# Patient Record
Sex: Female | Born: 1945 | Race: Black or African American | Hispanic: No | Smoking: Former smoker
Health system: Southern US, Community
[De-identification: ages and names within clinical notes are randomized; demographics above are authoritative.]

## PROBLEM LIST (undated history)

## (undated) DIAGNOSIS — R7303 Prediabetes: Secondary | ICD-10-CM

## (undated) DIAGNOSIS — I10 Essential (primary) hypertension: Secondary | ICD-10-CM

## (undated) DIAGNOSIS — E0590 Thyrotoxicosis, unspecified without thyrotoxic crisis or storm: Secondary | ICD-10-CM

## (undated) DIAGNOSIS — E7800 Pure hypercholesterolemia, unspecified: Secondary | ICD-10-CM

## (undated) DIAGNOSIS — M109 Gout, unspecified: Secondary | ICD-10-CM

## (undated) HISTORY — PX: BREAST EXCISIONAL BIOPSY: SUR124

## (undated) HISTORY — DX: Prediabetes: R73.03

## (undated) HISTORY — DX: Gout, unspecified: M10.9

## (undated) HISTORY — PX: COLONOSCOPY: SHX5424

---

## 1978-04-22 HISTORY — PX: ABDOMINAL HYSTERECTOMY: SHX81

## 2006-01-02 ENCOUNTER — Ambulatory Visit: Payer: Self-pay | Admitting: Internal Medicine

## 2006-02-03 ENCOUNTER — Ambulatory Visit: Payer: Self-pay | Admitting: Internal Medicine

## 2006-08-08 ENCOUNTER — Ambulatory Visit: Payer: Self-pay | Admitting: Family Medicine

## 2007-04-27 ENCOUNTER — Ambulatory Visit: Payer: Self-pay | Admitting: Gastroenterology

## 2007-05-26 ENCOUNTER — Ambulatory Visit: Payer: Self-pay | Admitting: Internal Medicine

## 2010-11-12 ENCOUNTER — Ambulatory Visit: Payer: Self-pay | Admitting: Internal Medicine

## 2010-11-20 ENCOUNTER — Ambulatory Visit: Payer: Self-pay | Admitting: Internal Medicine

## 2011-01-15 ENCOUNTER — Emergency Department: Payer: Self-pay | Admitting: Emergency Medicine

## 2011-11-25 ENCOUNTER — Ambulatory Visit: Payer: Self-pay

## 2012-01-16 ENCOUNTER — Ambulatory Visit: Payer: Self-pay

## 2013-02-11 ENCOUNTER — Ambulatory Visit: Payer: Self-pay | Admitting: Internal Medicine

## 2014-04-19 ENCOUNTER — Ambulatory Visit: Payer: Self-pay | Admitting: Internal Medicine

## 2015-02-06 ENCOUNTER — Other Ambulatory Visit: Payer: Self-pay | Admitting: Internal Medicine

## 2015-02-06 DIAGNOSIS — Z1231 Encounter for screening mammogram for malignant neoplasm of breast: Secondary | ICD-10-CM

## 2015-04-21 ENCOUNTER — Ambulatory Visit
Admission: RE | Admit: 2015-04-21 | Discharge: 2015-04-21 | Disposition: A | Discharge status: Home / Self Care | Payer: Medicare Other | Source: Ambulatory Visit | Attending: Internal Medicine | Admitting: Internal Medicine

## 2015-04-21 ENCOUNTER — Other Ambulatory Visit: Payer: Self-pay | Admitting: Internal Medicine

## 2015-04-21 DIAGNOSIS — Z1231 Encounter for screening mammogram for malignant neoplasm of breast: Secondary | ICD-10-CM

## 2015-06-26 ENCOUNTER — Encounter: Payer: Medicare Other | Attending: Internal Medicine | Admitting: Dietician

## 2015-06-26 ENCOUNTER — Encounter: Payer: Self-pay | Admitting: Dietician

## 2015-06-26 VITALS — Ht 63.0 in | Wt 156.8 lb

## 2015-06-26 DIAGNOSIS — E663 Overweight: Secondary | ICD-10-CM

## 2015-06-26 DIAGNOSIS — R739 Hyperglycemia, unspecified: Secondary | ICD-10-CM

## 2015-06-26 NOTE — Progress Notes (Signed)
Medical Nutrition Therapy: Visit start time: 0930  end time: 1030  Assessment:  Diagnosis: hyperglycemia Past medical history: HTN, hyperlipidemia Psychosocial issues/ stress concerns: none Preferred learning method:  . Hands-on  Current weight: 156.8lbs  Height: 5'3" Medications, supplements: reviewed list in chart with patient  Progress and evaluation: Patient reports some weight loss, down to 151lbs in the past year, but more recently has regained a few lbs.          She is ready to implement further change to resume weight loss.          Her sister has diabetes, and patient is ready to make changes to prevent diabetes herself.    Physical activity: gym, walking 60-90 minutes (stays at gym for 2 hours)  Dietary Intake:  Usual eating pattern includes 2-3 meals and 2-3 snacks per day. Dining out frequency: 7 meals per week.  Breakfast: 9-9:30 coffee with no sugar, bacon and eggs, limiting bread. Sometimes cereal with almond milk unsweetened. Sometimes Herbalife smoothie with almond milk and banana Snack: sometimes candy bar or nabs and soda, or meat skins Lunch: often wendy's or [employer] salads 3-4 times a week; sandwich grilled cheese, or pinto beans, sometimes skips if not hungry..  Snack: nabs crackers, or pork rinds (meat skins) if no lunch Supper: same foods as lunch Snack: was eating ice cream, has been avoiding recently Beverages: water, black coffee (was drinking several tsps sugar until last week), stopped regular sodas, was drinking 1 Pepsi daily, some fruit juice.   Nutrition Care Education: Topics covered: Diabetes prevention, weight management Basic nutrition: basic food groups, appropriate nutrient balance, appropriate meal and snack schedule, general nutrition guidelines    Weight control: behavioral changes for weight loss; 1400kcal meal plan Diabetes: appropriate meal and snack schedule, appropriate carb intake and balance  Nutritional Diagnosis:  [place]-3.3  Overweight/obesity As related to history of excess calorie intake, inactivity.  As evidenced by patient report.  Intervention: Instruction as noted above.   Set goals with patient input.   Patient declined follow-up appointment at this time.   Education Materials given:  . General diet guidelines for Diabetes . Food lists/ Planning A Balanced Meal . Sample meal pattern/ menus: Quick and Healthy Meal Ideas . Goals/ instructions  Learner/ who was taught:  . Patient   Level of understanding: Marland Kitchen. Verbalizes/ demonstrates competency  Demonstrated degree of understanding via:   Teach back Learning barriers: . None  Willingness to learn/ readiness for change: . Eager, change in progress  Monitoring and Evaluation:  Dietary intake, exercise, BG control, and body weight      follow up: prn

## 2015-06-26 NOTE — Patient Instructions (Signed)
   Control food portions by eating 1 cup (fist-size) or less of starches, palm-size or less of meats.   Include generous portions of low-carb veggies.   Keep limiting sugar in foods and drinks, choose fruit with a few nuts or lowfat cheese, a cup of yogurt, or a low-sugar granola bar with peanut butter for healthy snacks.   Continue your exercise and add another day or 2 each week when you can.

## 2016-01-18 ENCOUNTER — Other Ambulatory Visit: Payer: Self-pay | Admitting: Internal Medicine

## 2016-01-18 DIAGNOSIS — Z1231 Encounter for screening mammogram for malignant neoplasm of breast: Secondary | ICD-10-CM

## 2016-04-23 ENCOUNTER — Ambulatory Visit
Admission: RE | Admit: 2016-04-23 | Discharge: 2016-04-23 | Disposition: A | Discharge status: Home / Self Care | Payer: Medicare Other | Source: Ambulatory Visit | Attending: Internal Medicine | Admitting: Internal Medicine

## 2016-04-23 DIAGNOSIS — Z1231 Encounter for screening mammogram for malignant neoplasm of breast: Secondary | ICD-10-CM | POA: Diagnosis not present

## 2016-10-29 ENCOUNTER — Emergency Department
Admission: EM | Admit: 2016-10-29 | Discharge: 2016-10-29 | Disposition: A | Discharge status: Home / Self Care | Payer: Medicare Other | Attending: Emergency Medicine | Admitting: Emergency Medicine

## 2016-10-29 ENCOUNTER — Emergency Department: Payer: Medicare Other

## 2016-10-29 DIAGNOSIS — Z79899 Other long term (current) drug therapy: Secondary | ICD-10-CM | POA: Diagnosis not present

## 2016-10-29 DIAGNOSIS — W1849XA Other slipping, tripping and stumbling without falling, initial encounter: Secondary | ICD-10-CM | POA: Insufficient documentation

## 2016-10-29 DIAGNOSIS — Y998 Other external cause status: Secondary | ICD-10-CM | POA: Insufficient documentation

## 2016-10-29 DIAGNOSIS — Y9367 Activity, basketball: Secondary | ICD-10-CM | POA: Diagnosis not present

## 2016-10-29 DIAGNOSIS — Z87891 Personal history of nicotine dependence: Secondary | ICD-10-CM | POA: Diagnosis not present

## 2016-10-29 DIAGNOSIS — S99911A Unspecified injury of right ankle, initial encounter: Secondary | ICD-10-CM | POA: Diagnosis present

## 2016-10-29 DIAGNOSIS — T1490XA Injury, unspecified, initial encounter: Secondary | ICD-10-CM

## 2016-10-29 DIAGNOSIS — Y929 Unspecified place or not applicable: Secondary | ICD-10-CM | POA: Insufficient documentation

## 2016-10-29 DIAGNOSIS — S93401A Sprain of unspecified ligament of right ankle, initial encounter: Secondary | ICD-10-CM | POA: Insufficient documentation

## 2016-10-29 NOTE — ED Notes (Signed)
See triage note  States she was playing b/b with grandchild  Twisted right ankle   Was able to bear wt at first now having increased pain with min swelling noted   Positive pulses noted

## 2016-10-29 NOTE — ED Provider Notes (Signed)
[facility] Emergency Department Provider Note ____________________________________________  Time seen: 1830  I have reviewed the triage vital signs and the nursing notes.  HISTORY  Chief Complaint  Ankle Pain  HPI Laurie Spears is a 71 y.o. female presents to the ED for evaluation of right ankle pain. She was playing basketball with her grandson, when she sustained an inversion injury to the ankle. She reports lateral ankle pain and stiffness. She denies any other injury at this time.   No past medical history on file.  There are no active problems to display for this patient.   Past Surgical History:  Procedure Laterality Date  . ABDOMINAL HYSTERECTOMY  1980  . BREAST EXCISIONAL BIOPSY Bilateral yrs ago   benign    Prior to Admission medications   Medication Sig Start Date End Date Taking? Authorizing Provider  Loratadine 10 MG CAPS Take 10 mg by mouth.    [provider]  losartan-hydrochlorothiazide (HYZAAR) 100-25 MG tablet Take by mouth.    [provider]  methimazole (TAPAZOLE) 5 MG tablet Take 5 mg by mouth. 01/12/15   [provider]  simvastatin (ZOCOR) 20 MG tablet Take 20 mg by mouth.    [provider]    Allergies Patient has no known allergies.  Family History  Problem Relation Age of Onset  . Breast cancer Neg Hx     Social History Social History  Substance Use Topics  . Smoking status: Former Smoker    Quit date: 06/26/1994  . Smokeless tobacco: Not on file  . Alcohol use No    Review of Systems  Constitutional: Negative for fever. Cardiovascular: Negative for chest pain. Respiratory: Negative for shortness of breath. Musculoskeletal: Negative for back pain. Right ankle pain as above.  Skin: Negative for rash. Neurological: Negative for headaches, focal weakness or numbness. ____________________________________________  PHYSICAL EXAM:  VITAL SIGNS: ED Triage Vitals  Enc Vitals  Group     BP 10/29/16 1810 126/87     Pulse --      Resp 10/29/16 1810 16     Temp 10/29/16 1810 98.6 F (37 C)     Temp Source 10/29/16 1810 Oral     SpO2 10/29/16 1810 96 %     Weight 10/29/16 1811 158 lb (71.7 kg)     Height 10/29/16 1811 5\' 2"  (1.575 m)     Head Circumference --      Peak Flow --      Pain Score 10/29/16 1810 5     Pain Loc --      Pain Edu? --      Excl. in GC? --     Constitutional: Alert and oriented. Well appearing and in no distress. Head: Normocephalic and atraumatic. Cardiovascular: Normal rate, regular rhythm. Normal distal pulses. Respiratory: Normal respiratory effort. No wheezes/rales/rhonchi. Musculoskeletal: Right foot and ankle without any obvious deformity, dislocation, or effusion. Patient with normal ankle range of motion in all planes. Negative anterior anterior/posterior drawer. No calf or Achilles tenderness is noted. Nontender with normal range of motion in all extremities.  Neurologic:  Antalgic gait without ataxia. Normal speech and language. No gross focal neurologic deficits are appreciated. Skin:  Skin is warm, dry and intact. No rash noted. ____________________________________________   RADIOLOGY  Right Ankle  IMPRESSION: 1. No acute fracture or dislocation identified. 2. Productive changes of the talar neck may represent anterior ankle Impingement.  I, Lashan Gluth, Charlesetta IvoryJenise V Bacon, personally viewed and evaluated these images (plain radiographs)  as part of my medical decision making, as well as reviewing the written report by the radiologist. ____________________________________________  PROCEDURES  Velcro Stirrup Splint Crutches ____________________________________________  INITIAL IMPRESSION / ASSESSMENT AND PLAN / ED COURSE  Patient with ED evaluation of a ankle sprain on the right. No radiologic evidence of fracture or dislocation. Patient does have some mild impingement noted on x-ray due to bone spur dorsally. She is  fitted with a Velcro stirrup splint and given crutches to ambulate. She will follow with podiatry or return to the ED as needed. She is inclined to dose Tylenol and Motrin for pain relief. ____________________________________________  FINAL CLINICAL IMPRESSION(S) / ED DIAGNOSES  Final diagnoses:  Injury  Sprain of right ankle, unspecified ligament, initial encounter      Lissa Hoard, PA-C 10/29/16 2101    Sharman Cheek, MD 10/29/16 2313

## 2016-10-29 NOTE — ED Triage Notes (Signed)
Pt was playing basketball with her grandson and twisted her ankle. Was able to walk on it but painful

## 2016-10-29 NOTE — Discharge Instructions (Signed)
Your x-ray did not reveal a fracture. Wear the ankle brace when walking. Use the crutches to assist. Rest with the foot elevated when seated. Follow-up with podiatry for continued symptoms. Take ibuprofen and/or Tylenol for pain relief.

## 2017-05-05 ENCOUNTER — Other Ambulatory Visit: Payer: Self-pay | Admitting: Internal Medicine

## 2017-05-05 DIAGNOSIS — Z1231 Encounter for screening mammogram for malignant neoplasm of breast: Secondary | ICD-10-CM

## 2017-05-26 ENCOUNTER — Ambulatory Visit
Admission: RE | Admit: 2017-05-26 | Discharge: 2017-05-26 | Disposition: A | Discharge status: Home / Self Care | Payer: Medicare Other | Source: Ambulatory Visit | Attending: Internal Medicine | Admitting: Internal Medicine

## 2017-05-26 ENCOUNTER — Other Ambulatory Visit: Payer: Self-pay | Admitting: Internal Medicine

## 2017-05-26 DIAGNOSIS — Z1231 Encounter for screening mammogram for malignant neoplasm of breast: Secondary | ICD-10-CM | POA: Diagnosis not present

## 2018-06-16 ENCOUNTER — Other Ambulatory Visit: Payer: Self-pay | Admitting: Family

## 2018-06-16 DIAGNOSIS — Z1231 Encounter for screening mammogram for malignant neoplasm of breast: Secondary | ICD-10-CM

## 2018-07-07 ENCOUNTER — Other Ambulatory Visit: Payer: Self-pay

## 2018-07-07 ENCOUNTER — Ambulatory Visit
Admission: RE | Admit: 2018-07-07 | Discharge: 2018-07-07 | Disposition: A | Discharge status: Home / Self Care | Payer: Medicare Other | Source: Ambulatory Visit | Attending: Family | Admitting: Family

## 2018-07-07 DIAGNOSIS — Z1231 Encounter for screening mammogram for malignant neoplasm of breast: Secondary | ICD-10-CM | POA: Insufficient documentation

## 2019-02-05 ENCOUNTER — Other Ambulatory Visit: Payer: Self-pay | Admitting: Internal Medicine

## 2019-02-05 DIAGNOSIS — M79601 Pain in right arm: Secondary | ICD-10-CM

## 2019-02-12 ENCOUNTER — Ambulatory Visit
Admission: RE | Admit: 2019-02-12 | Discharge: 2019-02-12 | Disposition: A | Discharge status: Home / Self Care | Payer: Medicare Other | Source: Ambulatory Visit | Attending: Internal Medicine | Admitting: Internal Medicine

## 2019-02-12 ENCOUNTER — Other Ambulatory Visit: Payer: Self-pay

## 2019-02-12 ENCOUNTER — Encounter (INDEPENDENT_AMBULATORY_CARE_PROVIDER_SITE_OTHER): Payer: Self-pay

## 2019-02-12 DIAGNOSIS — M79601 Pain in right arm: Secondary | ICD-10-CM | POA: Insufficient documentation

## 2019-07-27 ENCOUNTER — Other Ambulatory Visit: Payer: Self-pay | Admitting: Internal Medicine

## 2019-07-27 DIAGNOSIS — Z1231 Encounter for screening mammogram for malignant neoplasm of breast: Secondary | ICD-10-CM

## 2019-08-10 ENCOUNTER — Ambulatory Visit
Admission: RE | Admit: 2019-08-10 | Discharge: 2019-08-10 | Disposition: A | Discharge status: Home / Self Care | Payer: Medicare Other | Source: Ambulatory Visit | Attending: Internal Medicine | Admitting: Internal Medicine

## 2019-08-10 DIAGNOSIS — Z1231 Encounter for screening mammogram for malignant neoplasm of breast: Secondary | ICD-10-CM

## 2020-06-12 ENCOUNTER — Other Ambulatory Visit: Payer: Self-pay | Admitting: Internal Medicine

## 2020-06-12 DIAGNOSIS — Z1231 Encounter for screening mammogram for malignant neoplasm of breast: Secondary | ICD-10-CM

## 2020-07-24 DIAGNOSIS — I10 Essential (primary) hypertension: Secondary | ICD-10-CM | POA: Diagnosis not present

## 2020-07-24 DIAGNOSIS — R7302 Impaired glucose tolerance (oral): Secondary | ICD-10-CM | POA: Diagnosis not present

## 2020-07-24 DIAGNOSIS — M109 Gout, unspecified: Secondary | ICD-10-CM | POA: Diagnosis not present

## 2020-07-24 DIAGNOSIS — E782 Mixed hyperlipidemia: Secondary | ICD-10-CM | POA: Diagnosis not present

## 2020-08-15 ENCOUNTER — Ambulatory Visit
Admission: RE | Admit: 2020-08-15 | Discharge: 2020-08-15 | Disposition: A | Discharge status: Home / Self Care | Payer: Medicare Other | Source: Ambulatory Visit | Attending: Internal Medicine | Admitting: Internal Medicine

## 2020-08-15 ENCOUNTER — Other Ambulatory Visit: Payer: Self-pay

## 2020-08-15 DIAGNOSIS — Z1231 Encounter for screening mammogram for malignant neoplasm of breast: Secondary | ICD-10-CM | POA: Diagnosis not present

## 2020-11-27 DIAGNOSIS — E782 Mixed hyperlipidemia: Secondary | ICD-10-CM | POA: Diagnosis not present

## 2020-11-27 DIAGNOSIS — R7302 Impaired glucose tolerance (oral): Secondary | ICD-10-CM | POA: Diagnosis not present

## 2020-11-27 DIAGNOSIS — I10 Essential (primary) hypertension: Secondary | ICD-10-CM | POA: Diagnosis not present

## 2020-11-27 DIAGNOSIS — M109 Gout, unspecified: Secondary | ICD-10-CM | POA: Diagnosis not present

## 2020-12-04 DIAGNOSIS — E0520 Thyrotoxicosis with toxic multinodular goiter without thyrotoxic crisis or storm: Secondary | ICD-10-CM | POA: Diagnosis not present

## 2020-12-11 DIAGNOSIS — E0520 Thyrotoxicosis with toxic multinodular goiter without thyrotoxic crisis or storm: Secondary | ICD-10-CM | POA: Diagnosis not present

## 2021-01-15 DIAGNOSIS — H25813 Combined forms of age-related cataract, bilateral: Secondary | ICD-10-CM | POA: Diagnosis not present

## 2021-01-15 DIAGNOSIS — H5203 Hypermetropia, bilateral: Secondary | ICD-10-CM | POA: Diagnosis not present

## 2021-01-15 DIAGNOSIS — H524 Presbyopia: Secondary | ICD-10-CM | POA: Diagnosis not present

## 2021-01-15 DIAGNOSIS — H52223 Regular astigmatism, bilateral: Secondary | ICD-10-CM | POA: Diagnosis not present

## 2021-01-20 ENCOUNTER — Emergency Department
Admission: EM | Admit: 2021-01-20 | Discharge: 2021-01-20 | Disposition: A | Discharge status: Home / Self Care | Payer: Medicare Other | Attending: Emergency Medicine | Admitting: Emergency Medicine

## 2021-01-20 ENCOUNTER — Emergency Department: Payer: Medicare Other

## 2021-01-20 ENCOUNTER — Encounter: Payer: Self-pay | Admitting: Intensive Care

## 2021-01-20 ENCOUNTER — Other Ambulatory Visit: Payer: Self-pay

## 2021-01-20 DIAGNOSIS — R112 Nausea with vomiting, unspecified: Secondary | ICD-10-CM | POA: Insufficient documentation

## 2021-01-20 DIAGNOSIS — I10 Essential (primary) hypertension: Secondary | ICD-10-CM | POA: Diagnosis not present

## 2021-01-20 DIAGNOSIS — Z20822 Contact with and (suspected) exposure to covid-19: Secondary | ICD-10-CM | POA: Diagnosis not present

## 2021-01-20 DIAGNOSIS — Z79899 Other long term (current) drug therapy: Secondary | ICD-10-CM | POA: Diagnosis not present

## 2021-01-20 DIAGNOSIS — Z87891 Personal history of nicotine dependence: Secondary | ICD-10-CM | POA: Insufficient documentation

## 2021-01-20 DIAGNOSIS — R42 Dizziness and giddiness: Secondary | ICD-10-CM | POA: Insufficient documentation

## 2021-01-20 DIAGNOSIS — J029 Acute pharyngitis, unspecified: Secondary | ICD-10-CM | POA: Diagnosis not present

## 2021-01-20 DIAGNOSIS — R059 Cough, unspecified: Secondary | ICD-10-CM

## 2021-01-20 DIAGNOSIS — R109 Unspecified abdominal pain: Secondary | ICD-10-CM | POA: Diagnosis not present

## 2021-01-20 HISTORY — DX: Essential (primary) hypertension: I10

## 2021-01-20 HISTORY — DX: Pure hypercholesterolemia, unspecified: E78.00

## 2021-01-20 HISTORY — DX: Thyrotoxicosis, unspecified without thyrotoxic crisis or storm: E05.90

## 2021-01-20 LAB — BASIC METABOLIC PANEL
Anion gap: 9 (ref 5–15)
BUN: 13 mg/dL (ref 8–23)
CO2: 23 mmol/L (ref 22–32)
Calcium: 8.4 mg/dL — ABNORMAL LOW (ref 8.9–10.3)
Chloride: 102 mmol/L (ref 98–111)
Creatinine, Ser: 0.83 mg/dL (ref 0.44–1.00)
GFR, Estimated: >60 mL/min (ref >60)
Glucose, Bld: 117 mg/dL — ABNORMAL HIGH (ref 70–99)
Potassium: 3.7 mmol/L (ref 3.5–5.1)
Sodium: 134 mmol/L — ABNORMAL LOW (ref 135–145)

## 2021-01-20 LAB — CBC
HCT: 36.5 % (ref 36.0–46.0)
Hemoglobin: 12.5 g/dL (ref 12.0–15.0)
MCH: 29.3 pg (ref 26.0–34.0)
MCHC: 34.2 g/dL (ref 30.0–36.0)
MCV: 85.5 fL (ref 80.0–100.0)
Platelets: 203 10*3/uL (ref 150–400)
RBC: 4.27 MIL/uL (ref 3.87–5.11)
RDW: 13.3 % (ref 11.5–15.5)
WBC: 6.9 10*3/uL (ref 4.0–10.5)
nRBC: 0 % (ref 0.0–0.2)

## 2021-01-20 LAB — TROPONIN I (HIGH SENSITIVITY): Troponin I (High Sensitivity): <2 ng/L (ref <18)

## 2021-01-20 LAB — RESP PANEL BY RT-PCR (FLU A&B, COVID) ARPGX2
Influenza A by PCR: NEGATIVE
Influenza B by PCR: NEGATIVE
SARS Coronavirus 2 by RT PCR: NEGATIVE

## 2021-01-20 MED ORDER — ONDANSETRON HCL 4 MG PO TABS: 4.0000 mg | ORAL_TABLET | Freq: Three times a day (TID) | ORAL | 0 refills | Status: DC | PRN

## 2021-01-20 NOTE — ED Triage Notes (Signed)
Pt c/o dizziness while at work accompanied with sore throat, abdominal discomfort and emesis.

## 2021-01-20 NOTE — ED Provider Notes (Signed)
[facility] Emergency Department Provider Note  ____________________________________________   I have reviewed the triage vital signs and the nursing notes.   HISTORY  Chief Complaint Dizziness, Emesis, and Abdominal Pain   History limited by: Not Limited   HPI Laurie Spears is a 75 y.o. female who presents to the emergency department today because of concern for nausea, vomiting and lightheadedness. Symptoms occurred when the patient was at work. She states that she started to feel nauseous so went to the bathroom. Did throw up multiple times. She then felt lightheaded and dizzy. Felt like she would not be able to get up off the floor. The patient states that she had similar episode once in the past where she throw up and felt lightheaded. Did not seek care at that time. She additionally states that over the past few days she has had cold like symptoms. Cough, sore throat. Denies any fevers or chills.   Records reviewed. Per medical record review patient has a history of HLD, HTN.   Past Medical History:  Diagnosis Date   High cholesterol    Hypertension    Hyperthyroidism     There are no problems to display for this patient.   Past Surgical History:  Procedure Laterality Date   ABDOMINAL HYSTERECTOMY  1980   BREAST EXCISIONAL BIOPSY Bilateral yrs ago   benign    Prior to Admission medications   Medication Sig Start Date End Date Taking? Authorizing Provider  Loratadine 10 MG CAPS Take 10 mg by mouth.    [provider]  losartan-hydrochlorothiazide (HYZAAR) 100-25 MG tablet Take by mouth.    [provider]  methimazole (TAPAZOLE) 5 MG tablet Take 5 mg by mouth. 01/12/15   [provider]  simvastatin (ZOCOR) 20 MG tablet Take 20 mg by mouth.    [provider]    Allergies Patient has no known allergies.  Family History  Problem Relation Age of Onset   Breast cancer Neg Hx     Social History Social  History   Tobacco Use   Smoking status: Former    Types: Cigarettes    Quit date: 06/26/1994    Years since quitting: 26.5  Substance Use Topics   Alcohol use: No    Alcohol/week: 0.0 standard drinks   Drug use: Never    Review of Systems Constitutional: No fever/chills Eyes: No visual changes. ENT: No sore throat. Cardiovascular: Denies chest pain. Respiratory: Positive for cough. Gastrointestinal: No abdominal pain.  Positive for nausea, vomiting.  Genitourinary: Negative for dysuria. Musculoskeletal: Negative for back pain. Skin: Negative for rash. Neurological: Positive for lightheadedness.  ____________________________________________   PHYSICAL EXAM:  VITAL SIGNS: ED Triage Vitals [01/20/21 1254]  Enc Vitals Group     BP 128/71     Pulse Rate 75     Resp 16     Temp 98.8 F (37.1 C)     Temp Source Oral     SpO2 96 %     Weight 160 lb (72.6 kg)     Height 5\' 3"  (1.6 m)     Head Circumference      Peak Flow      Pain Score 0   Constitutional: Alert and oriented.  Eyes: Conjunctivae are normal.  ENT      Head: Normocephalic and atraumatic.      Nose: No congestion/rhinnorhea.      Mouth/Throat: Mucous membranes are moist.      Neck: No stridor. Hematological/Lymphatic/Immunilogical: No  cervical lymphadenopathy. Cardiovascular: Normal rate, regular rhythm.  No murmurs, rubs, or gallops.  Respiratory: Normal respiratory effort without tachypnea nor retractions. Breath sounds are clear and equal bilaterally. No wheezes/rales/rhonchi. Gastrointestinal: Soft and non tender. No rebound. No guarding.  Genitourinary: Deferred Musculoskeletal: Normal range of motion in all extremities. No lower extremity edema. Neurologic:  Normal speech and language. No gross focal neurologic deficits are appreciated.  Skin:  Skin is warm, dry and intact. No rash noted. Psychiatric: Mood and affect are normal. Speech and behavior are normal. Patient exhibits appropriate insight  and judgment.  ____________________________________________    LABS (pertinent positives/negatives)  CBC wbc 6.9, hgb 12.5, plt 203 BMP wnl except na 134, glu 117, ca 8.4 COVID negative Trop hs <2 ____________________________________________   EKG  I, Phineas Semen, attending physician, personally viewed and interpreted this EKG  EKG Time: 1258 Rate: 74 Rhythm: normal sinus rhythm Axis: normal Intervals: qtc 408 QRS: narrow, q waves III ST changes: no st elevation Impression: abnormal ekg  ____________________________________________    RADIOLOGY  CXR No acute cardiopulmonary process  ____________________________________________   PROCEDURES  Procedures  ____________________________________________   INITIAL IMPRESSION / ASSESSMENT AND PLAN / ED COURSE  Pertinent labs & imaging results that were available during my care of the patient were reviewed by me and considered in my medical decision making (see chart for details).   Patient presented to the emergency department today because of concerns for an episode of nausea vomiting and lightheadedness that occurred while she was at work.  Patient does feel better here in the emergency department.  Work-up without any concerning anemia or leukocytosis.  BMP without any concerning electrolyte abnormalities.  Troponin was negative.  At this time unclear etiology.  Do wonder if patient might of suffered from a viral illness given her recent cold-like symptoms.  Given that patient feels better I do think is reasonable for her to be discharged home.  Discussed findings and plan with patient.  ____________________________________________   FINAL CLINICAL IMPRESSION(S) / ED DIAGNOSES  Final diagnoses:  Nausea and vomiting, unspecified vomiting type  Lightheadedness     Note: This dictation was prepared with Dragon dictation. Any transcriptional errors that result from this process are unintentional      Phineas Semen, MD 01/20/21 1811

## 2021-01-20 NOTE — ED Notes (Signed)
Patient c/o overall weakness and dizziness that started this am. States she has had cold like symptoms for the last two days.

## 2021-01-20 NOTE — ED Notes (Signed)
Patient given discharge instructions, all questions answered. Patient in possession of all belongings, directed to the discharge area  

## 2021-01-20 NOTE — Discharge Instructions (Addendum)
Please seek medical attention for any high fevers, chest pain, shortness of breath, change in behavior, persistent vomiting, bloody stool or any other new or concerning symptoms.  

## 2021-02-05 DIAGNOSIS — Z23 Encounter for immunization: Secondary | ICD-10-CM | POA: Diagnosis not present

## 2021-04-02 DIAGNOSIS — R7302 Impaired glucose tolerance (oral): Secondary | ICD-10-CM | POA: Diagnosis not present

## 2021-04-02 DIAGNOSIS — I10 Essential (primary) hypertension: Secondary | ICD-10-CM | POA: Diagnosis not present

## 2021-04-02 DIAGNOSIS — E782 Mixed hyperlipidemia: Secondary | ICD-10-CM | POA: Diagnosis not present

## 2021-04-02 DIAGNOSIS — M109 Gout, unspecified: Secondary | ICD-10-CM | POA: Diagnosis not present

## 2021-04-02 DIAGNOSIS — E559 Vitamin D deficiency, unspecified: Secondary | ICD-10-CM | POA: Diagnosis not present

## 2021-05-20 DIAGNOSIS — I10 Essential (primary) hypertension: Secondary | ICD-10-CM | POA: Diagnosis not present

## 2021-05-20 DIAGNOSIS — M109 Gout, unspecified: Secondary | ICD-10-CM | POA: Diagnosis not present

## 2021-05-20 DIAGNOSIS — E782 Mixed hyperlipidemia: Secondary | ICD-10-CM | POA: Diagnosis not present

## 2021-07-09 DIAGNOSIS — H25813 Combined forms of age-related cataract, bilateral: Secondary | ICD-10-CM | POA: Diagnosis not present

## 2021-07-09 DIAGNOSIS — H3561 Retinal hemorrhage, right eye: Secondary | ICD-10-CM | POA: Diagnosis not present

## 2021-07-30 ENCOUNTER — Other Ambulatory Visit: Payer: Self-pay | Admitting: Internal Medicine

## 2021-07-30 DIAGNOSIS — M2550 Pain in unspecified joint: Secondary | ICD-10-CM | POA: Diagnosis not present

## 2021-07-30 DIAGNOSIS — I10 Essential (primary) hypertension: Secondary | ICD-10-CM | POA: Diagnosis not present

## 2021-07-30 DIAGNOSIS — R7302 Impaired glucose tolerance (oral): Secondary | ICD-10-CM | POA: Diagnosis not present

## 2021-07-30 DIAGNOSIS — M8589 Other specified disorders of bone density and structure, multiple sites: Secondary | ICD-10-CM | POA: Diagnosis not present

## 2021-07-30 DIAGNOSIS — E782 Mixed hyperlipidemia: Secondary | ICD-10-CM | POA: Diagnosis not present

## 2021-07-30 DIAGNOSIS — Z1231 Encounter for screening mammogram for malignant neoplasm of breast: Secondary | ICD-10-CM

## 2021-08-19 DIAGNOSIS — M109 Gout, unspecified: Secondary | ICD-10-CM | POA: Diagnosis not present

## 2021-08-19 DIAGNOSIS — I10 Essential (primary) hypertension: Secondary | ICD-10-CM | POA: Diagnosis not present

## 2021-08-19 DIAGNOSIS — E782 Mixed hyperlipidemia: Secondary | ICD-10-CM | POA: Diagnosis not present

## 2021-09-04 DIAGNOSIS — H25013 Cortical age-related cataract, bilateral: Secondary | ICD-10-CM | POA: Diagnosis not present

## 2021-09-04 DIAGNOSIS — H2513 Age-related nuclear cataract, bilateral: Secondary | ICD-10-CM | POA: Diagnosis not present

## 2021-09-04 DIAGNOSIS — H2511 Age-related nuclear cataract, right eye: Secondary | ICD-10-CM | POA: Diagnosis not present

## 2021-09-04 DIAGNOSIS — H18413 Arcus senilis, bilateral: Secondary | ICD-10-CM | POA: Diagnosis not present

## 2021-09-04 DIAGNOSIS — H40013 Open angle with borderline findings, low risk, bilateral: Secondary | ICD-10-CM | POA: Diagnosis not present

## 2021-09-10 ENCOUNTER — Ambulatory Visit
Admission: RE | Admit: 2021-09-10 | Discharge: 2021-09-10 | Disposition: A | Discharge status: Home / Self Care | Payer: Medicare Other | Source: Ambulatory Visit | Attending: Internal Medicine | Admitting: Internal Medicine

## 2021-09-10 ENCOUNTER — Inpatient Hospital Stay: Admission: RE | Admit: 2021-09-10 | Payer: Medicare Other | Source: Ambulatory Visit

## 2021-09-10 DIAGNOSIS — Z1231 Encounter for screening mammogram for malignant neoplasm of breast: Secondary | ICD-10-CM | POA: Diagnosis not present

## 2021-09-12 ENCOUNTER — Other Ambulatory Visit: Payer: Self-pay | Admitting: Internal Medicine

## 2021-09-13 ENCOUNTER — Other Ambulatory Visit: Payer: Self-pay | Admitting: Internal Medicine

## 2021-09-13 DIAGNOSIS — N6489 Other specified disorders of breast: Secondary | ICD-10-CM

## 2021-09-13 DIAGNOSIS — R928 Other abnormal and inconclusive findings on diagnostic imaging of breast: Secondary | ICD-10-CM

## 2021-09-25 ENCOUNTER — Ambulatory Visit
Admission: RE | Admit: 2021-09-25 | Discharge: 2021-09-25 | Disposition: A | Discharge status: Home / Self Care | Payer: Medicare Other | Source: Ambulatory Visit | Attending: Internal Medicine | Admitting: Internal Medicine

## 2021-09-25 ENCOUNTER — Other Ambulatory Visit: Payer: Medicare Other

## 2021-09-25 DIAGNOSIS — N6489 Other specified disorders of breast: Secondary | ICD-10-CM | POA: Diagnosis not present

## 2021-09-25 DIAGNOSIS — R928 Other abnormal and inconclusive findings on diagnostic imaging of breast: Secondary | ICD-10-CM | POA: Diagnosis not present

## 2021-09-25 DIAGNOSIS — R922 Inconclusive mammogram: Secondary | ICD-10-CM | POA: Diagnosis not present

## 2021-10-02 DIAGNOSIS — E782 Mixed hyperlipidemia: Secondary | ICD-10-CM | POA: Diagnosis not present

## 2021-10-02 DIAGNOSIS — I10 Essential (primary) hypertension: Secondary | ICD-10-CM | POA: Diagnosis not present

## 2021-10-02 DIAGNOSIS — Z79899 Other long term (current) drug therapy: Secondary | ICD-10-CM | POA: Diagnosis not present

## 2021-10-02 DIAGNOSIS — S8011XA Contusion of right lower leg, initial encounter: Secondary | ICD-10-CM | POA: Diagnosis not present

## 2021-10-02 DIAGNOSIS — R29890 Loss of height: Secondary | ICD-10-CM | POA: Diagnosis not present

## 2021-10-08 DIAGNOSIS — E0520 Thyrotoxicosis with toxic multinodular goiter without thyrotoxic crisis or storm: Secondary | ICD-10-CM | POA: Diagnosis not present

## 2021-10-15 DIAGNOSIS — E0520 Thyrotoxicosis with toxic multinodular goiter without thyrotoxic crisis or storm: Secondary | ICD-10-CM | POA: Diagnosis not present

## 2021-11-19 DIAGNOSIS — E782 Mixed hyperlipidemia: Secondary | ICD-10-CM | POA: Diagnosis not present

## 2021-11-19 DIAGNOSIS — M109 Gout, unspecified: Secondary | ICD-10-CM | POA: Diagnosis not present

## 2021-11-19 DIAGNOSIS — I10 Essential (primary) hypertension: Secondary | ICD-10-CM | POA: Diagnosis not present

## 2021-12-10 DIAGNOSIS — H2511 Age-related nuclear cataract, right eye: Secondary | ICD-10-CM | POA: Diagnosis not present

## 2021-12-11 DIAGNOSIS — H2512 Age-related nuclear cataract, left eye: Secondary | ICD-10-CM | POA: Diagnosis not present

## 2021-12-18 DIAGNOSIS — R7302 Impaired glucose tolerance (oral): Secondary | ICD-10-CM | POA: Diagnosis not present

## 2021-12-18 DIAGNOSIS — Z1231 Encounter for screening mammogram for malignant neoplasm of breast: Secondary | ICD-10-CM | POA: Diagnosis not present

## 2021-12-18 DIAGNOSIS — Z0001 Encounter for general adult medical examination with abnormal findings: Secondary | ICD-10-CM | POA: Diagnosis not present

## 2021-12-18 DIAGNOSIS — I10 Essential (primary) hypertension: Secondary | ICD-10-CM | POA: Diagnosis not present

## 2021-12-18 DIAGNOSIS — E782 Mixed hyperlipidemia: Secondary | ICD-10-CM | POA: Diagnosis not present

## 2021-12-18 DIAGNOSIS — E0580 Other thyrotoxicosis without thyrotoxic crisis or storm: Secondary | ICD-10-CM | POA: Diagnosis not present

## 2021-12-18 DIAGNOSIS — Z1211 Encounter for screening for malignant neoplasm of colon: Secondary | ICD-10-CM | POA: Diagnosis not present

## 2021-12-18 DIAGNOSIS — M109 Gout, unspecified: Secondary | ICD-10-CM | POA: Diagnosis not present

## 2021-12-31 DIAGNOSIS — H2512 Age-related nuclear cataract, left eye: Secondary | ICD-10-CM | POA: Diagnosis not present

## 2022-02-18 DIAGNOSIS — Z23 Encounter for immunization: Secondary | ICD-10-CM | POA: Diagnosis not present

## 2022-04-20 DIAGNOSIS — I10 Essential (primary) hypertension: Secondary | ICD-10-CM | POA: Diagnosis not present

## 2022-04-20 DIAGNOSIS — E782 Mixed hyperlipidemia: Secondary | ICD-10-CM | POA: Diagnosis not present

## 2022-04-23 DIAGNOSIS — E0520 Thyrotoxicosis with toxic multinodular goiter without thyrotoxic crisis or storm: Secondary | ICD-10-CM | POA: Diagnosis not present

## 2022-04-29 DIAGNOSIS — E0520 Thyrotoxicosis with toxic multinodular goiter without thyrotoxic crisis or storm: Secondary | ICD-10-CM | POA: Diagnosis not present

## 2022-05-06 DIAGNOSIS — I10 Essential (primary) hypertension: Secondary | ICD-10-CM | POA: Diagnosis not present

## 2022-05-06 DIAGNOSIS — E0580 Other thyrotoxicosis without thyrotoxic crisis or storm: Secondary | ICD-10-CM | POA: Diagnosis not present

## 2022-05-06 DIAGNOSIS — R7302 Impaired glucose tolerance (oral): Secondary | ICD-10-CM | POA: Diagnosis not present

## 2022-05-06 DIAGNOSIS — M109 Gout, unspecified: Secondary | ICD-10-CM | POA: Diagnosis not present

## 2022-05-06 DIAGNOSIS — E782 Mixed hyperlipidemia: Secondary | ICD-10-CM | POA: Diagnosis not present

## 2022-08-07 ENCOUNTER — Other Ambulatory Visit: Payer: Self-pay | Admitting: Internal Medicine

## 2022-08-09 ENCOUNTER — Other Ambulatory Visit: Payer: Self-pay | Admitting: Internal Medicine

## 2022-08-09 ENCOUNTER — Other Ambulatory Visit: Payer: Self-pay | Admitting: Family

## 2022-08-09 DIAGNOSIS — I10 Essential (primary) hypertension: Secondary | ICD-10-CM

## 2022-09-09 ENCOUNTER — Encounter: Payer: Self-pay | Admitting: Internal Medicine

## 2022-09-09 ENCOUNTER — Ambulatory Visit (INDEPENDENT_AMBULATORY_CARE_PROVIDER_SITE_OTHER): Payer: Medicare Other | Admitting: Internal Medicine

## 2022-09-09 ENCOUNTER — Other Ambulatory Visit: Payer: Self-pay | Admitting: Internal Medicine

## 2022-09-09 VITALS — BP 120/80 | HR 66 | Ht 63.0 in | Wt 168.4 lb

## 2022-09-09 DIAGNOSIS — M109 Gout, unspecified: Secondary | ICD-10-CM

## 2022-09-09 DIAGNOSIS — Z0000 Encounter for general adult medical examination without abnormal findings: Secondary | ICD-10-CM | POA: Insufficient documentation

## 2022-09-09 DIAGNOSIS — I10 Essential (primary) hypertension: Secondary | ICD-10-CM | POA: Insufficient documentation

## 2022-09-09 DIAGNOSIS — E0590 Thyrotoxicosis, unspecified without thyrotoxic crisis or storm: Secondary | ICD-10-CM | POA: Diagnosis not present

## 2022-09-09 DIAGNOSIS — R7302 Impaired glucose tolerance (oral): Secondary | ICD-10-CM

## 2022-09-09 DIAGNOSIS — E782 Mixed hyperlipidemia: Secondary | ICD-10-CM | POA: Insufficient documentation

## 2022-09-09 DIAGNOSIS — Z1231 Encounter for screening mammogram for malignant neoplasm of breast: Secondary | ICD-10-CM

## 2022-09-09 NOTE — Progress Notes (Signed)
Established Patient Office Visit  Subjective:  Patient ID: Laurie Spears, female    DOB: 07-May-1945  Age: 77 y.o. MRN: [id number]  Chief Complaint  Patient presents with   Follow-up    4 month follow up    Patient comes in for her follow-up today.  She has been feeling well and has no new complaints.  She is fasting for blood work.  She is due for mammogram and she already has it scheduled for next month.  She reports that 2 weeks ago she felt some left flank pain but it resolved spontaneously. Patient has gained some weight this time but promises to start exercising and controlling her diet.    No other concerns at this time.   Past Medical History:  Diagnosis Date   Gouty arthritis    High cholesterol    Hypertension    Hyperthyroidism    Prediabetes     Past Surgical History:  Procedure Laterality Date   ABDOMINAL HYSTERECTOMY  1980   BREAST EXCISIONAL BIOPSY Bilateral yrs ago   benign    Social History   Socioeconomic History   Marital status: Divorced    Spouse name: Not on file   Number of children: Not on file   Years of education: Not on file   Highest education level: Not on file  Occupational History   Not on file  Tobacco Use   Smoking status: Former    Types: Cigarettes    Quit date: 06/26/1994    Years since quitting: 28.2   Smokeless tobacco: Not on file  Substance and Sexual Activity   Alcohol use: No    Alcohol/week: 0.0 standard drinks of alcohol   Drug use: Never   Sexual activity: Not on file  Other Topics Concern   Not on file  Social History Narrative   Not on file   Social Determinants of Health   Financial Resource Strain: Not on file  Food Insecurity: Not on file  Transportation Needs: Not on file  Physical Activity: Not on file  Stress: Not on file  Social Connections: Not on file  Intimate Partner Violence: Not on file    Family History  Problem Relation Age of Onset   Breast cancer Neg Hx     No Known  Allergies  Review of Systems  Constitutional:  Negative for chills, diaphoresis, fever, malaise/fatigue and weight loss.  HENT:  Negative for ear pain, hearing loss, sore throat and tinnitus.   Eyes: Negative.   Respiratory:  Negative for cough, shortness of breath and wheezing.   Cardiovascular:  Negative for chest pain, palpitations, leg swelling and PND.  Gastrointestinal:  Negative for abdominal pain, constipation, diarrhea, heartburn, melena, nausea and vomiting.  Genitourinary:  Negative for dysuria, flank pain, frequency, hematuria and urgency.  Musculoskeletal:  Negative for falls, joint pain, myalgias and neck pain.  Skin: Negative.   Neurological: Negative.   Psychiatric/Behavioral:  Negative for depression. The patient is not nervous/anxious.        Objective:   BP 120/80   Pulse 66   Ht 5\' 3"  (1.6 m)   Wt 168 lb 6.4 oz (76.4 kg)   SpO2 96%   BMI 29.83 kg/m   Vitals:   09/09/22 0855  BP: 120/80  Pulse: 66  Height: 5\' 3"  (1.6 m)  Weight: 168 lb 6.4 oz (76.4 kg)  SpO2: 96%  BMI (Calculated): 29.84    Physical Exam Vitals and nursing note reviewed.  Constitutional:  Appearance: Normal appearance.  HENT:     Right Ear: Tympanic membrane and ear canal normal.     Left Ear: Tympanic membrane normal.     Nose: Nose normal.  Cardiovascular:     Rate and Rhythm: Normal rate.  Pulmonary:     Effort: Pulmonary effort is normal.     Breath sounds: Normal breath sounds. No wheezing or rales.  Abdominal:     General: Bowel sounds are normal. There is no distension.     Palpations: Abdomen is soft. There is no mass.     Tenderness: There is no right CVA tenderness, left CVA tenderness or guarding.     Hernia: No hernia is present.  Musculoskeletal:        General: Normal range of motion.     Cervical back: Normal range of motion and neck supple.  Skin:    General: Skin is warm and dry.  Neurological:     General: No focal deficit present.     Mental  Status: She is alert and oriented to person, place, and time.  Psychiatric:        Mood and Affect: Mood normal.        Behavior: Behavior normal.      No results found for any visits on 09/09/22.  No results found for this or any previous visit (from the past 2160 hour(s)).    Assessment & Plan:  Check fasting labs today.  Patient will start a strict diet plan.  Will monitor weight at follow-up. Problem List Items Addressed This Visit     Essential hypertension, benign - Primary   Relevant Medications   rosuvastatin (CRESTOR) 40 MG tablet   Other Relevant Orders   CMP14+EGFR   Impaired glucose tolerance   Relevant Orders   Hemoglobin A1c   Gouty arthritis   Relevant Medications   allopurinol (ZYLOPRIM) 100 MG tablet   Other Relevant Orders   CBC With Differential   Uric acid   Mixed hyperlipidemia   Relevant Medications   rosuvastatin (CRESTOR) 40 MG tablet   Other Relevant Orders   Lipid Panel w/o Chol/HDL Ratio   Breast cancer screening by mammogram   Relevant Orders   MM 3D SCREENING MAMMOGRAM BILATERAL BREAST   Hyperthyroidism    Return in about 3 months (around 12/10/2022).   Total time spent: 30 minutes  Margaretann Loveless, MD  09/09/2022   This document may have been prepared by [facility] Voice Recognition software and as such may include unintentional dictation errors.

## 2022-09-10 LAB — CMP14+EGFR
ALT: 39 IU/L — ABNORMAL HIGH (ref 0–32)
AST: 30 IU/L (ref 0–40)
Albumin/Globulin Ratio: 1.8 (ref 1.2–2.2)
Albumin: 4.4 g/dL (ref 3.8–4.8)
Alkaline Phosphatase: 67 IU/L (ref 44–121)
BUN/Creatinine Ratio: 14 (ref 12–28)
BUN: 13 mg/dL (ref 8–27)
Bilirubin Total: 0.6 mg/dL (ref 0.0–1.2)
CO2: 24 mmol/L (ref 20–29)
Calcium: 9.6 mg/dL (ref 8.7–10.3)
Chloride: 105 mmol/L (ref 96–106)
Creatinine, Ser: 0.93 mg/dL (ref 0.57–1.00)
Globulin, Total: 2.4 g/dL (ref 1.5–4.5)
Glucose: 110 mg/dL — ABNORMAL HIGH (ref 70–99)
Potassium: 4.8 mmol/L (ref 3.5–5.2)
Sodium: 140 mmol/L (ref 134–144)
Total Protein: 6.8 g/dL (ref 6.0–8.5)
eGFR: 64 mL/min/{1.73_m2} (ref >59)

## 2022-09-10 LAB — LIPID PANEL W/O CHOL/HDL RATIO
Cholesterol, Total: 135 mg/dL (ref 100–199)
HDL: 51 mg/dL (ref >39)
LDL Chol Calc (NIH): 70 mg/dL (ref 0–99)
Triglycerides: 68 mg/dL (ref 0–149)
VLDL Cholesterol Cal: 14 mg/dL (ref 5–40)

## 2022-09-10 LAB — CBC WITH DIFFERENTIAL
Basophils Absolute: 0 10*3/uL (ref 0.0–0.2)
Basos: 1 %
EOS (ABSOLUTE): 0.2 10*3/uL (ref 0.0–0.4)
Eos: 5 %
Hematocrit: 40.7 % (ref 34.0–46.6)
Hemoglobin: 13.3 g/dL (ref 11.1–15.9)
Immature Grans (Abs): 0 10*3/uL (ref 0.0–0.1)
Immature Granulocytes: 0 %
Lymphocytes Absolute: 1.3 10*3/uL (ref 0.7–3.1)
Lymphs: 31 %
MCH: 28.2 pg (ref 26.6–33.0)
MCHC: 32.7 g/dL (ref 31.5–35.7)
MCV: 86 fL (ref 79–97)
Monocytes Absolute: 0.3 10*3/uL (ref 0.1–0.9)
Monocytes: 8 %
Neutrophils Absolute: 2.4 10*3/uL (ref 1.4–7.0)
Neutrophils: 55 %
RBC: 4.72 x10E6/uL (ref 3.77–5.28)
RDW: 13.7 % (ref 11.7–15.4)
WBC: 4.3 10*3/uL (ref 3.4–10.8)

## 2022-09-10 LAB — URIC ACID: Uric Acid: 4.7 mg/dL (ref 3.1–7.9)

## 2022-09-10 LAB — HEMOGLOBIN A1C
Est. average glucose Bld gHb Est-mCnc: 137 mg/dL
Hgb A1c MFr Bld: 6.4 % — ABNORMAL HIGH (ref 4.8–5.6)

## 2022-09-12 ENCOUNTER — Other Ambulatory Visit: Payer: Self-pay | Admitting: Internal Medicine

## 2022-09-12 DIAGNOSIS — R7303 Prediabetes: Secondary | ICD-10-CM

## 2022-09-12 MED ORDER — METFORMIN HCL ER 500 MG PO TB24: 500.0000 mg | ORAL_TABLET | Freq: Every day | ORAL | 3 refills | Status: DC

## 2022-09-20 ENCOUNTER — Other Ambulatory Visit: Payer: Self-pay | Admitting: Family

## 2022-10-18 ENCOUNTER — Other Ambulatory Visit: Payer: Self-pay | Admitting: Family

## 2022-10-23 ENCOUNTER — Other Ambulatory Visit: Payer: Self-pay | Admitting: Internal Medicine

## 2022-10-28 DIAGNOSIS — E0520 Thyrotoxicosis with toxic multinodular goiter without thyrotoxic crisis or storm: Secondary | ICD-10-CM | POA: Diagnosis not present

## 2022-10-29 ENCOUNTER — Ambulatory Visit
Admission: RE | Admit: 2022-10-29 | Discharge: 2022-10-29 | Disposition: A | Discharge status: Home / Self Care | Payer: Medicare Other | Source: Ambulatory Visit | Attending: Internal Medicine | Admitting: Internal Medicine

## 2022-10-29 DIAGNOSIS — Z1231 Encounter for screening mammogram for malignant neoplasm of breast: Secondary | ICD-10-CM | POA: Insufficient documentation

## 2022-10-31 NOTE — Progress Notes (Signed)
Patient notified

## 2022-11-05 DIAGNOSIS — E0520 Thyrotoxicosis with toxic multinodular goiter without thyrotoxic crisis or storm: Secondary | ICD-10-CM | POA: Diagnosis not present

## 2022-11-25 ENCOUNTER — Telehealth: Payer: Medicare Other

## 2022-12-10 ENCOUNTER — Encounter: Payer: Self-pay | Admitting: Internal Medicine

## 2022-12-10 ENCOUNTER — Ambulatory Visit: Payer: Medicare Other | Admitting: Internal Medicine

## 2022-12-10 VITALS — BP 108/74 | HR 61 | Ht 63.0 in | Wt 158.4 lb

## 2022-12-10 DIAGNOSIS — E782 Mixed hyperlipidemia: Secondary | ICD-10-CM

## 2022-12-10 DIAGNOSIS — M109 Gout, unspecified: Secondary | ICD-10-CM | POA: Diagnosis not present

## 2022-12-10 DIAGNOSIS — E0590 Thyrotoxicosis, unspecified without thyrotoxic crisis or storm: Secondary | ICD-10-CM | POA: Diagnosis not present

## 2022-12-10 DIAGNOSIS — R7303 Prediabetes: Secondary | ICD-10-CM | POA: Diagnosis not present

## 2022-12-10 DIAGNOSIS — I10 Essential (primary) hypertension: Secondary | ICD-10-CM

## 2022-12-10 NOTE — Progress Notes (Signed)
Established Patient Office Visit  Subjective:  Patient ID: Laurie Spears, female    DOB: January 01, 1946  Age: 77 y.o. MRN: [id number]  Chief Complaint  Patient presents with   Follow-up    3 month follow up    Patient comes in for her follow-up.  She has lost some weight with the help of diet control and regular exercise.  Her blood pressure looks very good.  However she reports that she does not like the metformin as it causes upset stomach. Will stop it for now. She is fasting for blood work, will also check her hemoglobin A1c. Her thyroid function is under good control with methimazole, under care of endocrinologist.    No other concerns at this time.   Past Medical History:  Diagnosis Date   Gouty arthritis    High cholesterol    Hypertension    Hyperthyroidism    Prediabetes     Past Surgical History:  Procedure Laterality Date   ABDOMINAL HYSTERECTOMY  1980   BREAST EXCISIONAL BIOPSY Bilateral yrs ago   benign    Social History   Socioeconomic History   Marital status: Divorced    Spouse name: Not on file   Number of children: Not on file   Years of education: Not on file   Highest education level: Not on file  Occupational History   Not on file  Tobacco Use   Smoking status: Former    Current packs/day: 0.00    Types: Cigarettes    Quit date: 06/26/1994    Years since quitting: 28.4   Smokeless tobacco: Not on file  Substance and Sexual Activity   Alcohol use: No    Alcohol/week: 0.0 standard drinks of alcohol   Drug use: Never   Sexual activity: Not on file  Other Topics Concern   Not on file  Social History Narrative   Not on file   Social Determinants of Health   Financial Resource Strain: Not on file  Food Insecurity: Not on file  Transportation Needs: Not on file  Physical Activity: Not on file  Stress: Not on file  Social Connections: Not on file  Intimate Partner Violence: Not on file    Family History  Problem Relation Age of Onset    Breast cancer Neg Hx     No Known Allergies  Review of Systems  Constitutional: Negative.  Negative for diaphoresis, fever and malaise/fatigue.  HENT: Negative.  Negative for sinus pain and sore throat.   Eyes: Negative.   Respiratory: Negative.  Negative for cough, shortness of breath and stridor.   Cardiovascular: Negative.  Negative for chest pain, palpitations and leg swelling.  Gastrointestinal: Negative.  Negative for abdominal pain, constipation, diarrhea, heartburn, nausea and vomiting.  Genitourinary: Negative.  Negative for dysuria and flank pain.  Musculoskeletal: Negative.  Negative for joint pain and myalgias.  Skin: Negative.   Neurological: Negative.  Negative for dizziness and headaches.  Endo/Heme/Allergies: Negative.   Psychiatric/Behavioral: Negative.  Negative for depression and suicidal ideas. The patient is not nervous/anxious.        Objective:   BP 108/74   Pulse 61   Ht 5\' 3"  (1.6 m)   Wt 158 lb 6.4 oz (71.8 kg)   SpO2 99%   BMI 28.06 kg/m   Vitals:   12/10/22 0942  BP: 108/74  Pulse: 61  Height: 5\' 3"  (1.6 m)  Weight: 158 lb 6.4 oz (71.8 kg)  SpO2: 99%  BMI (Calculated): 28.07  Physical Exam Vitals and nursing note reviewed.  Constitutional:      Appearance: Normal appearance.  HENT:     Head: Normocephalic and atraumatic.     Nose: Nose normal.     Mouth/Throat:     Mouth: Mucous membranes are moist.     Pharynx: Oropharynx is clear.  Eyes:     Conjunctiva/sclera: Conjunctivae normal.     Pupils: Pupils are equal, round, and reactive to light.  Cardiovascular:     Rate and Rhythm: Normal rate and regular rhythm.     Pulses: Normal pulses.     Heart sounds: Normal heart sounds. No murmur heard. Pulmonary:     Effort: Pulmonary effort is normal.     Breath sounds: Normal breath sounds. No wheezing.  Chest:  Breasts:    Right: Normal.     Left: Normal.  Abdominal:     General: Bowel sounds are normal.     Palpations:  Abdomen is soft.     Tenderness: There is no abdominal tenderness. There is no right CVA tenderness or left CVA tenderness.  Musculoskeletal:        General: Normal range of motion.     Cervical back: Normal range of motion.     Right lower leg: No edema.     Left lower leg: No edema.  Skin:    General: Skin is warm and dry.  Neurological:     General: No focal deficit present.     Mental Status: She is alert and oriented to person, place, and time.  Psychiatric:        Mood and Affect: Mood normal.        Behavior: Behavior normal.      No results found for any visits on 12/10/22.  No results found for this or any previous visit (from the past 2160 hour(s)).    Assessment & Plan:  To continue current medications.  Stop metformin.  Check blood pressure at home. Labs today. Problem List Items Addressed This Visit     Essential hypertension, benign   Relevant Orders   CMP14+EGFR   Gouty arthritis - Primary   Relevant Orders   CBC with Diff   Uric acid   Mixed hyperlipidemia   Relevant Orders   Lipid Panel w/o Chol/HDL Ratio   Hyperthyroidism   Other Visit Diagnoses     Prediabetes       Relevant Orders   Hemoglobin A1c       Return in about 3 months (around 03/12/2023).   Total time spent: 30 minutes  Margaretann Loveless, MD  12/10/2022   This document may have been prepared by [facility] Voice Recognition software and as such may include unintentional dictation errors.

## 2022-12-11 LAB — CBC WITH DIFFERENTIAL/PLATELET
Basophils Absolute: 0 10*3/uL (ref 0.0–0.2)
Basos: 0 %
EOS (ABSOLUTE): 0.2 10*3/uL (ref 0.0–0.4)
Eos: 4 %
Hematocrit: 41.8 % (ref 34.0–46.6)
Hemoglobin: 13.6 g/dL (ref 11.1–15.9)
Immature Grans (Abs): 0 10*3/uL (ref 0.0–0.1)
Immature Granulocytes: 0 %
Lymphocytes Absolute: 1.4 10*3/uL (ref 0.7–3.1)
Lymphs: 30 %
MCH: 28.3 pg (ref 26.6–33.0)
MCHC: 32.5 g/dL (ref 31.5–35.7)
MCV: 87 fL (ref 79–97)
Monocytes Absolute: 0.4 10*3/uL (ref 0.1–0.9)
Monocytes: 8 %
Neutrophils Absolute: 2.6 10*3/uL (ref 1.4–7.0)
Neutrophils: 58 %
Platelets: 233 10*3/uL (ref 150–450)
RBC: 4.81 x10E6/uL (ref 3.77–5.28)
RDW: 13.2 % (ref 11.7–15.4)
WBC: 4.5 10*3/uL (ref 3.4–10.8)

## 2022-12-11 LAB — CMP14+EGFR
ALT: 23 IU/L (ref 0–32)
AST: 21 IU/L (ref 0–40)
Albumin: 4.7 g/dL (ref 3.8–4.8)
Alkaline Phosphatase: 59 IU/L (ref 44–121)
BUN/Creatinine Ratio: 12 (ref 12–28)
BUN: 11 mg/dL (ref 8–27)
Bilirubin Total: 0.9 mg/dL (ref 0.0–1.2)
CO2: 24 mmol/L (ref 20–29)
Calcium: 10.3 mg/dL (ref 8.7–10.3)
Chloride: 104 mmol/L (ref 96–106)
Creatinine, Ser: 0.9 mg/dL (ref 0.57–1.00)
Globulin, Total: 2.4 g/dL (ref 1.5–4.5)
Glucose: 100 mg/dL — ABNORMAL HIGH (ref 70–99)
Potassium: 4.9 mmol/L (ref 3.5–5.2)
Sodium: 141 mmol/L (ref 134–144)
Total Protein: 7.1 g/dL (ref 6.0–8.5)
eGFR: 67 mL/min/{1.73_m2} (ref >59)

## 2022-12-11 LAB — HEMOGLOBIN A1C
Est. average glucose Bld gHb Est-mCnc: 131 mg/dL
Hgb A1c MFr Bld: 6.2 % — ABNORMAL HIGH (ref 4.8–5.6)

## 2022-12-11 LAB — LIPID PANEL W/O CHOL/HDL RATIO
Cholesterol, Total: 134 mg/dL (ref 100–199)
HDL: 52 mg/dL (ref >39)
LDL Chol Calc (NIH): 64 mg/dL (ref 0–99)
Triglycerides: 95 mg/dL (ref 0–149)
VLDL Cholesterol Cal: 18 mg/dL (ref 5–40)

## 2022-12-11 LAB — URIC ACID: Uric Acid: 4 mg/dL (ref 3.1–7.9)

## 2022-12-12 NOTE — Progress Notes (Signed)
Spoke with pt who verbalized understanding.

## 2022-12-16 ENCOUNTER — Other Ambulatory Visit: Payer: Self-pay | Admitting: Internal Medicine

## 2022-12-28 ENCOUNTER — Other Ambulatory Visit: Payer: Self-pay | Admitting: Family

## 2023-01-01 ENCOUNTER — Telehealth: Payer: Self-pay | Admitting: Internal Medicine

## 2023-01-01 NOTE — Telephone Encounter (Signed)
Patient left VM wanting to get her flu shot. Need to figure out when we will be doing a flu clinic and call her back and let her know.

## 2023-01-02 NOTE — Telephone Encounter (Signed)
Patient notified that at this time we are not offering flu shots and patient verbalized understanding.

## 2023-01-20 ENCOUNTER — Other Ambulatory Visit: Payer: Self-pay | Admitting: Internal Medicine

## 2023-03-03 DIAGNOSIS — M170 Bilateral primary osteoarthritis of knee: Secondary | ICD-10-CM | POA: Diagnosis not present

## 2023-03-18 IMAGING — MG MM DIGITAL DIAGNOSTIC UNILAT*L* W/ TOMO W/ CAD
6 series · 6 of 18 positions shown · non-contrast
Comparison: Previous exam(s).

CLINICAL DATA: Callback from screening mammogram for possible
asymmetry left breast

EXAM:
DIGITAL DIAGNOSTIC UNILATERAL LEFT MAMMOGRAM WITH TOMOSYNTHESIS AND
CAD
TECHNIQUE: Left digital diagnostic mammography and breast tomosynthesis was
performed. The images were evaluated with computer-aided detection.

[L ML synth-2D]
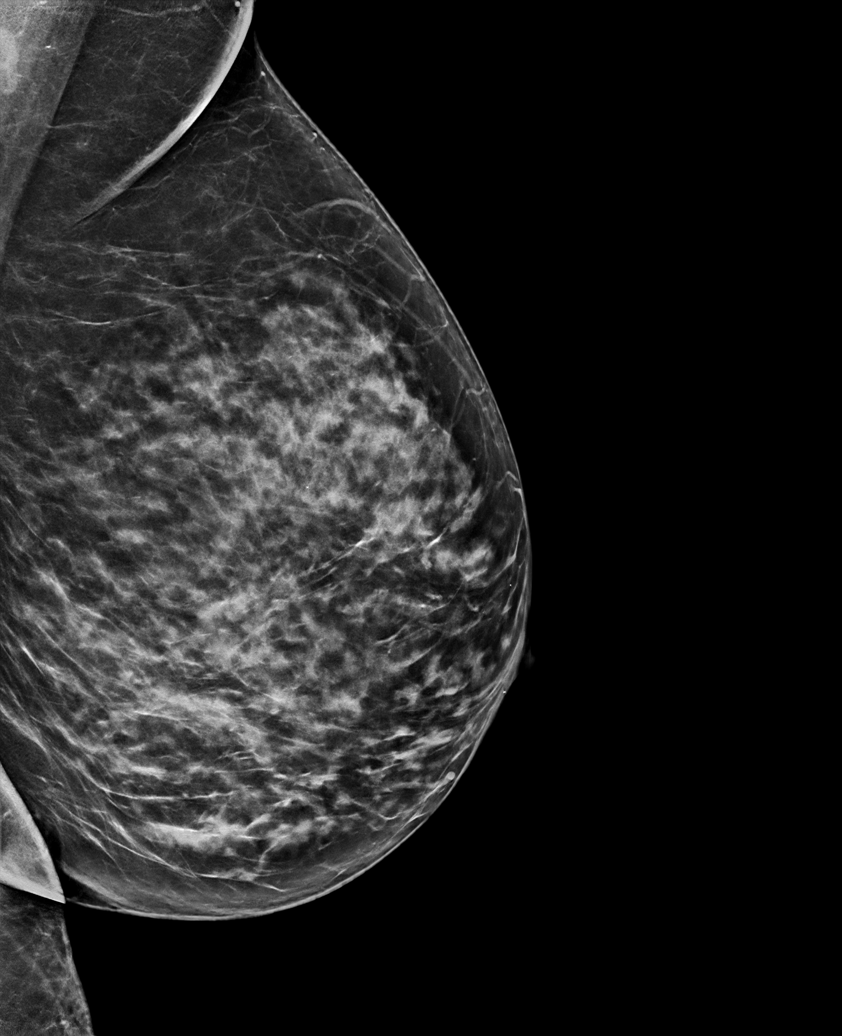

[L CC synth-2D (1 of 2)]
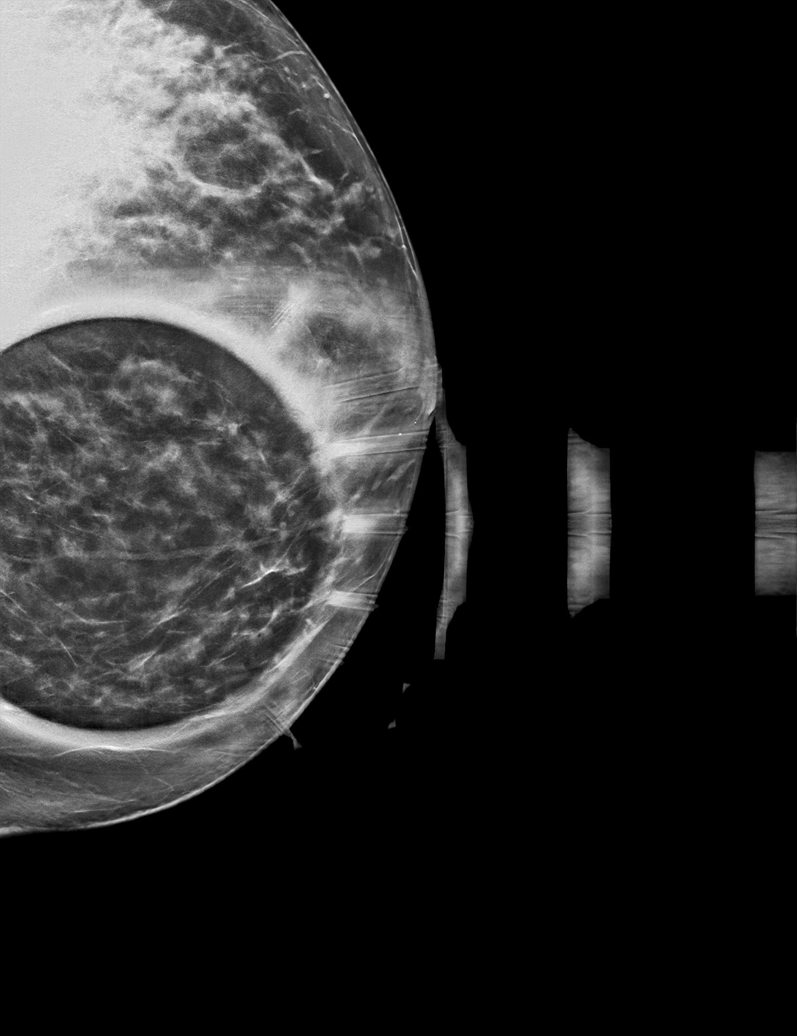

[L CC synth-2D (2 of 2)]
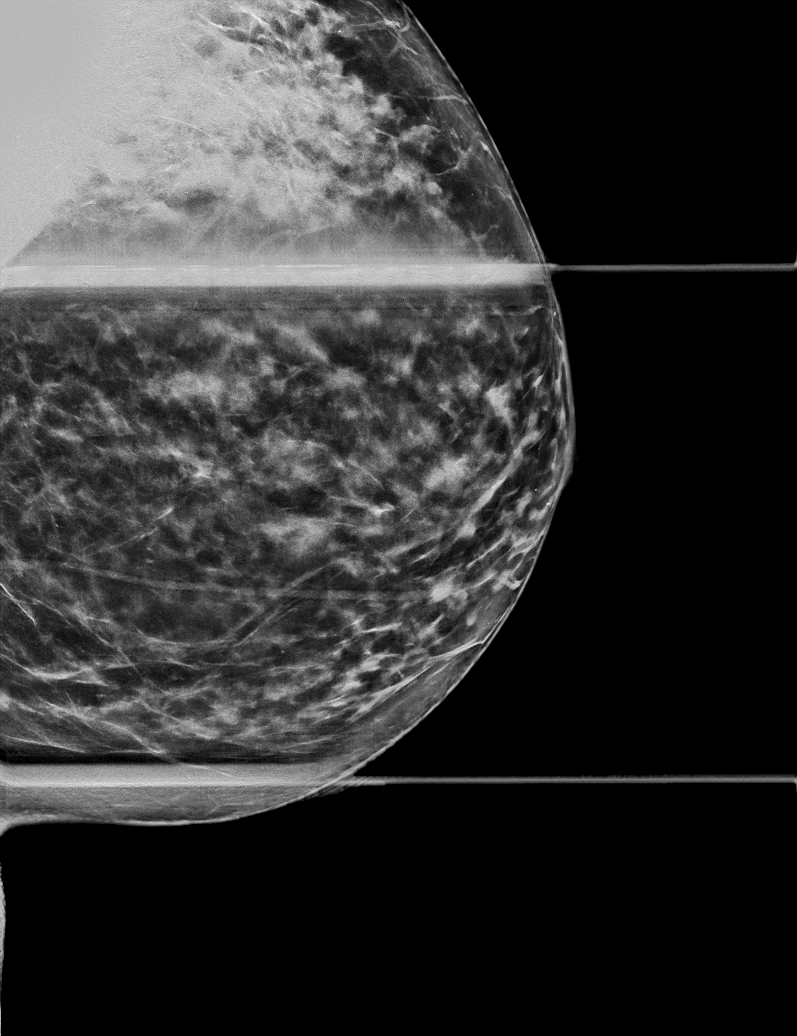

[L CC tomo (1 of 2) · tomo slice 39/78.0]
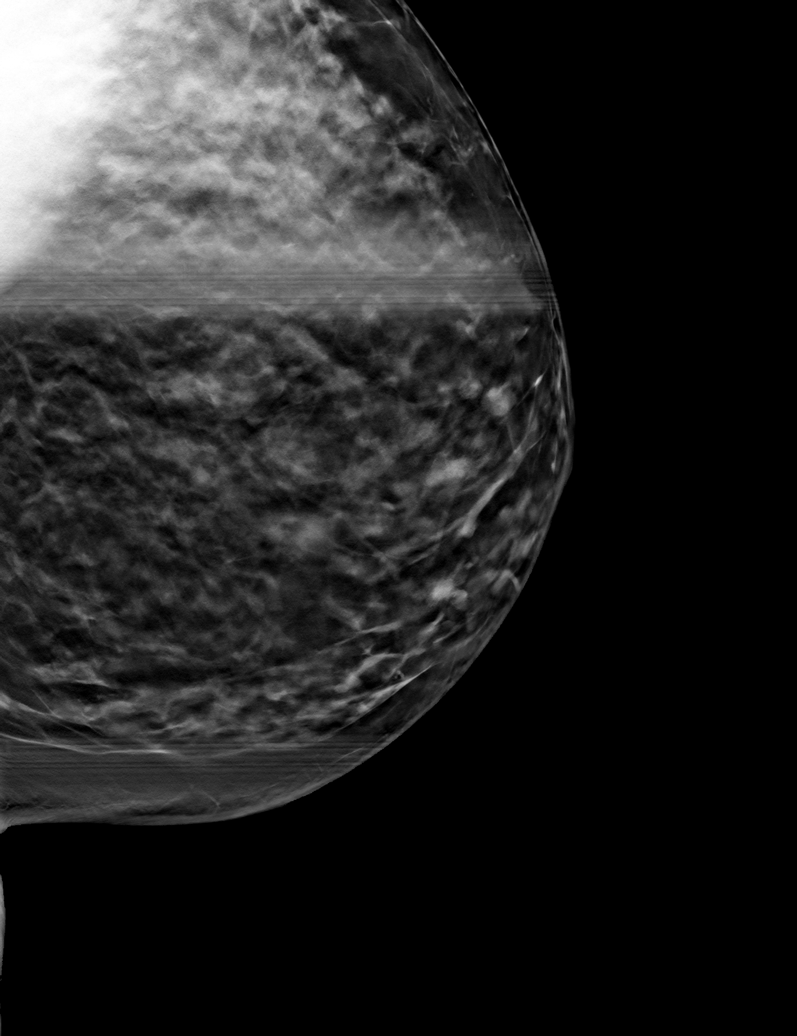

[L ML tomo · tomo slice 42/83.0]
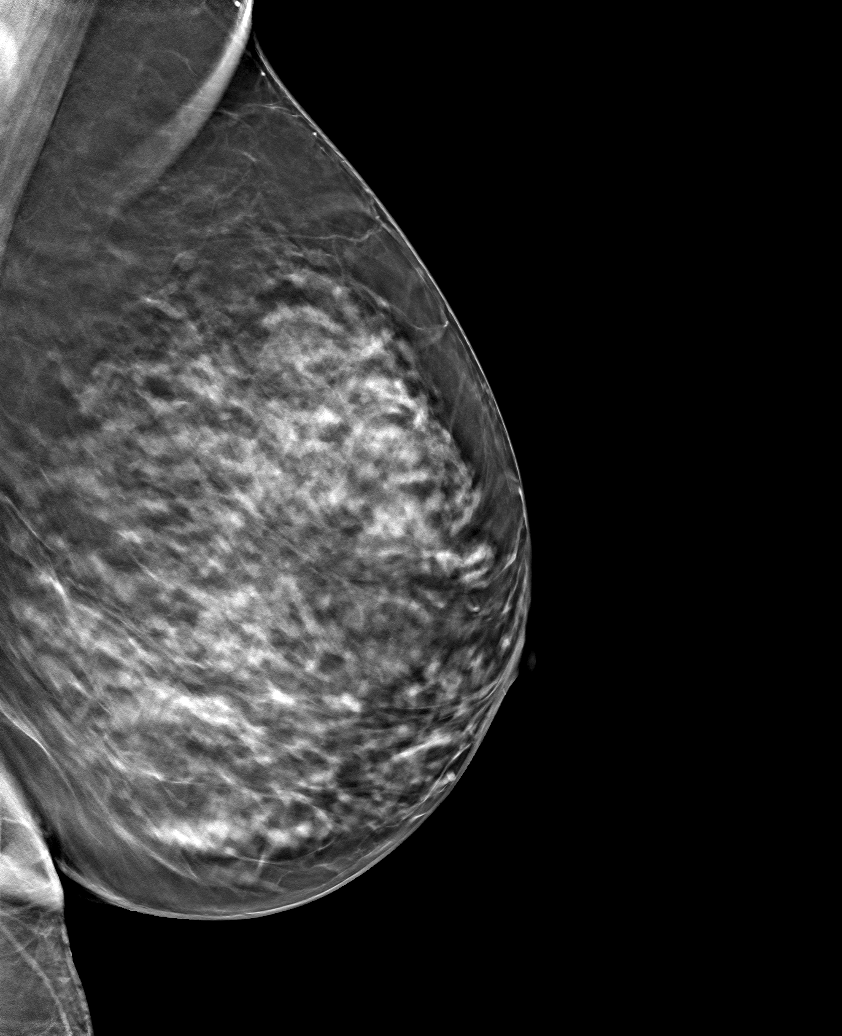

[L CC tomo (2 of 2) · tomo slice 35/69.0]
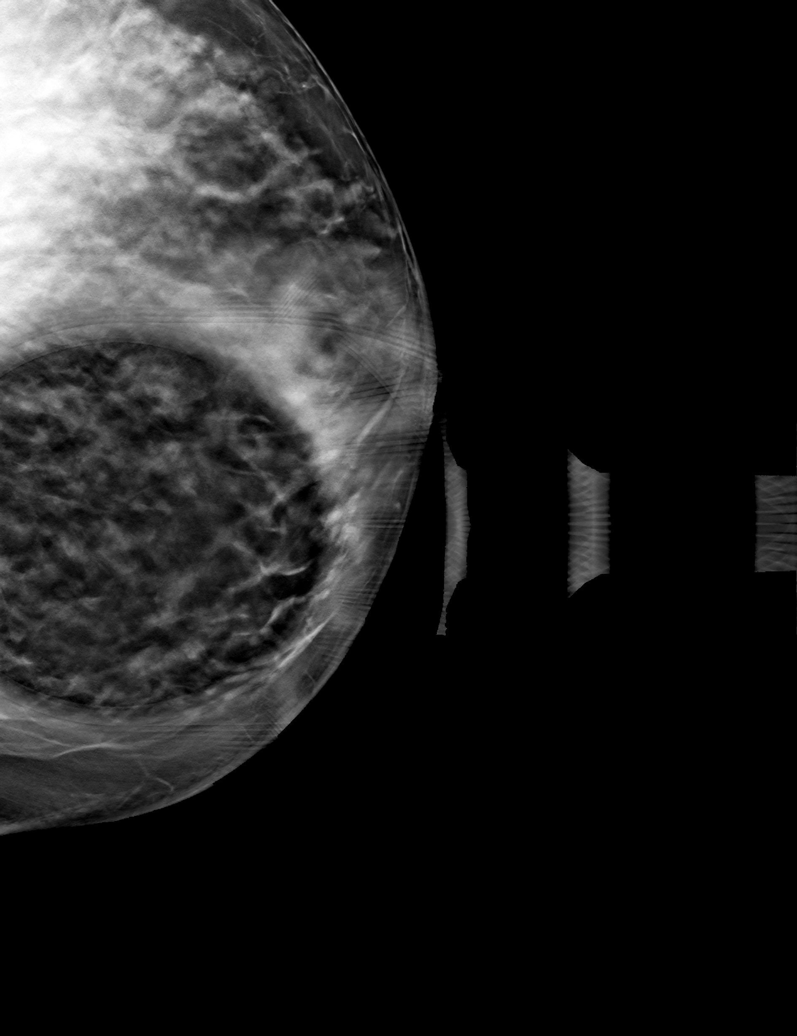

[6 of 18 positions shown; findings below may reference images not displayed]

ACR Breast Density Category c: The breast tissue is heterogeneously
dense, which may obscure small masses.
FINDINGS: Spot compression left cc view, lateral view of the left breast are
submitted. Previously questioned asymmetry does not persist on
additional views. No suspicious abnormalities identified.
IMPRESSION: Benign findings.

RECOMMENDATION:
Routine screening mammogram back on schedule.

I have discussed the findings and recommendations with the patient.
If applicable, a reminder letter will be sent to the patient
regarding the next appointment.

BI-RADS CATEGORY  2: Benign.

## 2023-03-25 ENCOUNTER — Ambulatory Visit (INDEPENDENT_AMBULATORY_CARE_PROVIDER_SITE_OTHER): Payer: Medicare Other | Admitting: Internal Medicine

## 2023-03-25 ENCOUNTER — Encounter: Payer: Self-pay | Admitting: Internal Medicine

## 2023-03-25 VITALS — BP 130/70 | HR 65 | Ht 63.0 in | Wt 162.6 lb

## 2023-03-25 DIAGNOSIS — M109 Gout, unspecified: Secondary | ICD-10-CM | POA: Diagnosis not present

## 2023-03-25 DIAGNOSIS — R7303 Prediabetes: Secondary | ICD-10-CM

## 2023-03-25 DIAGNOSIS — E782 Mixed hyperlipidemia: Secondary | ICD-10-CM | POA: Diagnosis not present

## 2023-03-25 DIAGNOSIS — I10 Essential (primary) hypertension: Secondary | ICD-10-CM | POA: Diagnosis not present

## 2023-03-25 NOTE — Progress Notes (Signed)
Established Patient Office Visit  Subjective:  Patient ID: Laurie Spears, female    DOB: [birth year]  Age: 77 y.o. MRN: [id number]  Chief Complaint  Patient presents with   Follow-up    3 month    Patient comes in for her follow-up today.  She has been doing well generally but was having pain in both her knee joints, went to her orthopedic and received injections in both her knees.  Now now feeling better.  Concerned about some weight gain since not able to exercise as before however will resume now.  She is fasting for blood work.    No other concerns at this time.   Past Medical History:  Diagnosis Date   Gouty arthritis    High cholesterol    Hypertension    Hyperthyroidism    Prediabetes     Past Surgical History:  Procedure Laterality Date   ABDOMINAL HYSTERECTOMY  1980   BREAST EXCISIONAL BIOPSY Bilateral yrs ago   benign    Social History   Socioeconomic History   Marital status: Divorced    Spouse name: Not on file   Number of children: Not on file   Years of education: Not on file   Highest education level: Not on file  Occupational History   Not on file  Tobacco Use   Smoking status: Former    Current packs/day: 0.00    Types: Cigarettes    Quit date: 06/26/1994    Years since quitting: 28.7   Smokeless tobacco: Not on file  Substance and Sexual Activity   Alcohol use: No    Alcohol/week: 0.0 standard drinks of alcohol   Drug use: Never   Sexual activity: Not on file  Other Topics Concern   Not on file  Social History Narrative   Not on file   Social Determinants of Health   Financial Resource Strain: Not on file  Food Insecurity: Not on file  Transportation Needs: Not on file  Physical Activity: Not on file  Stress: Not on file  Social Connections: Not on file  Intimate Partner Violence: Not on file    Family History  Problem Relation Age of Onset   Breast cancer Neg Hx     No Known Allergies  Outpatient Medications Prior to  Visit  Medication Sig   allopurinol (ZYLOPRIM) 100 MG tablet TAKE 1 TABLET BY MOUTH ONCE  DAILY   Calcium Carb-Cholecalciferol (CALCIUM HIGH POTENCY/VITAMIN D) 600-5 MG-MCG TABS Take 1 tablet by mouth 2 (two) times daily.   Loratadine 10 MG CAPS Take 10 mg by mouth.   losartan (COZAAR) 100 MG tablet TAKE 1 TABLET BY MOUTH DAILY   meloxicam (MOBIC) 15 MG tablet Take 1 tablet by mouth once daily   methimazole (TAPAZOLE) 5 MG tablet Take 5 mg by mouth.   rosuvastatin (CRESTOR) 40 MG tablet TAKE 1 TABLET BY MOUTH ONCE  DAILY   spironolactone (ALDACTONE) 25 MG tablet TAKE 1 TABLET BY MOUTH ONCE  DAILY   alendronate (FOSAMAX) 70 MG tablet TAKE 1 TABLET BY MOUTH WEEKLY   No facility-administered medications prior to visit.    Review of Systems  Constitutional: Negative.  Negative for chills, fever, malaise/fatigue and weight loss.  HENT: Negative.  Negative for sinus pain.   Eyes: Negative.   Respiratory: Negative.  Negative for cough, shortness of breath and stridor.   Cardiovascular: Negative.  Negative for chest pain, palpitations and leg swelling.  Gastrointestinal: Negative.  Negative for abdominal pain, constipation, diarrhea,  heartburn, nausea and vomiting.  Genitourinary: Negative.  Negative for dysuria, flank pain, hematuria and urgency.  Musculoskeletal: Negative.  Negative for joint pain and myalgias.  Skin: Negative.   Neurological: Negative.  Negative for dizziness, tingling, tremors and headaches.  Endo/Heme/Allergies: Negative.   Psychiatric/Behavioral: Negative.  Negative for depression and suicidal ideas. The patient is not nervous/anxious.        Objective:   BP 130/70   Pulse 65   Ht 5\' 3"  (1.6 m)   Wt 162 lb 9.6 oz (73.8 kg)   SpO2 96%   BMI 28.80 kg/m   Vitals:   03/25/23 0908  BP: 130/70  Pulse: 65  Height: 5\' 3"  (1.6 m)  Weight: 162 lb 9.6 oz (73.8 kg)  SpO2: 96%  BMI (Calculated): 28.81    Physical Exam Vitals and nursing note reviewed.   Constitutional:      Appearance: Normal appearance.  HENT:     Head: Normocephalic and atraumatic.     Nose: Nose normal.     Mouth/Throat:     Mouth: Mucous membranes are moist.     Pharynx: Oropharynx is clear.  Eyes:     Conjunctiva/sclera: Conjunctivae normal.     Pupils: Pupils are equal, round, and reactive to light.  Cardiovascular:     Rate and Rhythm: Normal rate and regular rhythm.     Pulses: Normal pulses.     Heart sounds: Normal heart sounds. No murmur heard. Pulmonary:     Effort: Pulmonary effort is normal.     Breath sounds: Normal breath sounds. No wheezing.  Abdominal:     General: Bowel sounds are normal.     Palpations: Abdomen is soft.     Tenderness: There is no abdominal tenderness. There is no right CVA tenderness or left CVA tenderness.  Musculoskeletal:        General: Normal range of motion.     Cervical back: Normal range of motion.     Right lower leg: No edema.     Left lower leg: No edema.  Skin:    General: Skin is warm and dry.  Neurological:     General: No focal deficit present.     Mental Status: She is alert and oriented to person, place, and time.  Psychiatric:        Mood and Affect: Mood normal.        Behavior: Behavior normal.      No results found for any visits on 03/25/23.  No results found for this or any previous visit (from the past 2160 hour(s)).    Assessment & Plan:  Continue all medications.  Labs today. Will return for annual wellness visit. Problem List Items Addressed This Visit     Essential hypertension, benign - Primary   Relevant Orders   CMP14+EGFR   Gouty arthritis   Relevant Orders   Uric acid   CBC with Diff   Mixed hyperlipidemia   Relevant Orders   Lipid Panel w/o Chol/HDL Ratio   Other Visit Diagnoses     Prediabetes       Relevant Orders   Hemoglobin A1c      Follow up as scheduled.  Total time spent: 30 minutes  Margaretann Loveless, MD  03/25/2023   This document may have been  prepared by [facility] Voice Recognition software and as such may include unintentional dictation errors.

## 2023-03-26 LAB — HEMOGLOBIN A1C
Est. average glucose Bld gHb Est-mCnc: 131 mg/dL
Hgb A1c MFr Bld: 6.2 % — ABNORMAL HIGH (ref 4.8–5.6)

## 2023-03-26 LAB — CMP14+EGFR
ALT: 43 [IU]/L — ABNORMAL HIGH (ref 0–32)
AST: 26 [IU]/L (ref 0–40)
Albumin: 4.2 g/dL (ref 3.8–4.8)
Alkaline Phosphatase: 52 [IU]/L (ref 44–121)
BUN/Creatinine Ratio: 16 (ref 12–28)
BUN: 15 mg/dL (ref 8–27)
Bilirubin Total: 0.7 mg/dL (ref 0.0–1.2)
CO2: 25 mmol/L (ref 20–29)
Calcium: 9.2 mg/dL (ref 8.7–10.3)
Chloride: 103 mmol/L (ref 96–106)
Creatinine, Ser: 0.94 mg/dL (ref 0.57–1.00)
Globulin, Total: 2.1 g/dL (ref 1.5–4.5)
Glucose: 100 mg/dL — ABNORMAL HIGH (ref 70–99)
Potassium: 4.8 mmol/L (ref 3.5–5.2)
Sodium: 141 mmol/L (ref 134–144)
Total Protein: 6.3 g/dL (ref 6.0–8.5)
eGFR: 63 mL/min/{1.73_m2} (ref >59)

## 2023-03-26 LAB — CBC WITH DIFFERENTIAL/PLATELET
Basophils Absolute: 0 10*3/uL (ref 0.0–0.2)
Basos: 0 %
EOS (ABSOLUTE): 0.2 10*3/uL (ref 0.0–0.4)
Eos: 3 %
Hematocrit: 39.9 % (ref 34.0–46.6)
Hemoglobin: 12.6 g/dL (ref 11.1–15.9)
Immature Grans (Abs): 0 10*3/uL (ref 0.0–0.1)
Immature Granulocytes: 0 %
Lymphocytes Absolute: 1.1 10*3/uL (ref 0.7–3.1)
Lymphs: 21 %
MCH: 28.3 pg (ref 26.6–33.0)
MCHC: 31.6 g/dL (ref 31.5–35.7)
MCV: 90 fL (ref 79–97)
Monocytes Absolute: 0.4 10*3/uL (ref 0.1–0.9)
Monocytes: 7 %
Neutrophils Absolute: 3.7 10*3/uL (ref 1.4–7.0)
Neutrophils: 69 %
Platelets: 218 10*3/uL (ref 150–450)
RBC: 4.46 x10E6/uL (ref 3.77–5.28)
RDW: 13.9 % (ref 11.7–15.4)
WBC: 5.3 10*3/uL (ref 3.4–10.8)

## 2023-03-26 LAB — LIPID PANEL W/O CHOL/HDL RATIO
Cholesterol, Total: 146 mg/dL (ref 100–199)
HDL: 61 mg/dL (ref >39)
LDL Chol Calc (NIH): 75 mg/dL (ref 0–99)
Triglycerides: 45 mg/dL (ref 0–149)
VLDL Cholesterol Cal: 10 mg/dL (ref 5–40)

## 2023-03-26 LAB — URIC ACID: Uric Acid: 5.9 mg/dL (ref 3.1–7.9)

## 2023-03-27 ENCOUNTER — Encounter: Payer: Self-pay | Admitting: Cardiology

## 2023-03-27 ENCOUNTER — Ambulatory Visit (INDEPENDENT_AMBULATORY_CARE_PROVIDER_SITE_OTHER): Payer: Medicare Other | Admitting: Cardiology

## 2023-03-27 VITALS — BP 118/74 | HR 67 | Ht 63.0 in | Wt 158.0 lb

## 2023-03-27 DIAGNOSIS — Z0000 Encounter for general adult medical examination without abnormal findings: Secondary | ICD-10-CM

## 2023-03-27 NOTE — Progress Notes (Signed)
Established Patient Office Visit  Subjective:  Patient ID: Laurie Spears, female    DOB: 01-26-1946  Age: 77 y.o. MRN: [id number]  Chief Complaint  Patient presents with   Annual Exam    Patient in office for annual medicare wellness visit. Patient doing well, no complaints today. Discussed recent lab work.     No other concerns at this time.   Past Medical History:  Diagnosis Date   Gouty arthritis    High cholesterol    Hypertension    Hyperthyroidism    Prediabetes     Past Surgical History:  Procedure Laterality Date   ABDOMINAL HYSTERECTOMY  1980   BREAST EXCISIONAL BIOPSY Bilateral yrs ago   benign    Social History   Socioeconomic History   Marital status: Divorced    Spouse name: Not on file   Number of children: Not on file   Years of education: Not on file   Highest education level: Not on file  Occupational History   Not on file  Tobacco Use   Smoking status: Former    Current packs/day: 0.00    Types: Cigarettes    Quit date: 06/26/1994    Years since quitting: 28.7   Smokeless tobacco: Not on file  Substance and Sexual Activity   Alcohol use: No    Alcohol/week: 0.0 standard drinks of alcohol   Drug use: Never   Sexual activity: Not on file  Other Topics Concern   Not on file  Social History Narrative   Not on file   Social Determinants of Health   Financial Resource Strain: Not on file  Food Insecurity: Not on file  Transportation Needs: Not on file  Physical Activity: Not on file  Stress: Not on file  Social Connections: Not on file  Intimate Partner Violence: Not on file    Family History  Problem Relation Age of Onset   Breast cancer Neg Hx     No Known Allergies  Outpatient Medications Prior to Visit  Medication Sig   alendronate (FOSAMAX) 70 MG tablet TAKE 1 TABLET BY MOUTH WEEKLY   allopurinol (ZYLOPRIM) 100 MG tablet TAKE 1 TABLET BY MOUTH ONCE  DAILY   Calcium Carb-Cholecalciferol (CALCIUM HIGH POTENCY/VITAMIN D)  600-5 MG-MCG TABS Take 1 tablet by mouth 2 (two) times daily.   Loratadine 10 MG CAPS Take 10 mg by mouth.   losartan (COZAAR) 100 MG tablet TAKE 1 TABLET BY MOUTH DAILY   meloxicam (MOBIC) 15 MG tablet Take 1 tablet by mouth once daily   methimazole (TAPAZOLE) 5 MG tablet Take 5 mg by mouth.   rosuvastatin (CRESTOR) 40 MG tablet TAKE 1 TABLET BY MOUTH ONCE  DAILY   spironolactone (ALDACTONE) 25 MG tablet TAKE 1 TABLET BY MOUTH ONCE  DAILY   No facility-administered medications prior to visit.    Review of Systems  Constitutional: Negative.   HENT: Negative.    Eyes: Negative.   Respiratory: Negative.  Negative for shortness of breath.   Cardiovascular: Negative.  Negative for chest pain.  Gastrointestinal: Negative.  Negative for abdominal pain, constipation and diarrhea.  Genitourinary: Negative.   Musculoskeletal:  Negative for joint pain and myalgias.  Skin: Negative.   Neurological: Negative.  Negative for dizziness and headaches.  Endo/Heme/Allergies: Negative.   All other systems reviewed and are negative.      Objective:   BP 118/74   Pulse 67   Ht 5\' 3"  (1.6 m)   Wt 158 lb (71.7 kg)  SpO2 99%   BMI 27.99 kg/m   Vitals:   03/27/23 1405  BP: 118/74  Pulse: 67  Height: 5\' 3"  (1.6 m)  Weight: 158 lb (71.7 kg)  SpO2: 99%  BMI (Calculated): 28    Physical Exam Vitals and nursing note reviewed.  Constitutional:      Appearance: Normal appearance. She is normal weight.  HENT:     Head: Normocephalic and atraumatic.     Nose: Nose normal.     Mouth/Throat:     Mouth: Mucous membranes are moist.  Eyes:     Extraocular Movements: Extraocular movements intact.     Conjunctiva/sclera: Conjunctivae normal.     Pupils: Pupils are equal, round, and reactive to light.  Cardiovascular:     Rate and Rhythm: Normal rate and regular rhythm.     Pulses: Normal pulses.     Heart sounds: Normal heart sounds.  Pulmonary:     Effort: Pulmonary effort is normal.      Breath sounds: Normal breath sounds.  Abdominal:     General: Abdomen is flat. Bowel sounds are normal.     Palpations: Abdomen is soft.  Musculoskeletal:        General: Normal range of motion.     Cervical back: Normal range of motion.  Skin:    General: Skin is warm and dry.  Neurological:     General: No focal deficit present.     Mental Status: She is alert and oriented to person, place, and time.  Psychiatric:        Mood and Affect: Mood normal.        Behavior: Behavior normal.        Thought Content: Thought content normal.        Judgment: Judgment normal.      No results found for any visits on 03/27/23.  Recent Results (from the past 2160 hour(s))  Lipid Panel w/o Chol/HDL Ratio     Status: None   Collection Time: 03/25/23  9:40 AM  Result Value Ref Range   Cholesterol, Total 146 100 - 199 mg/dL   Triglycerides 45 0 - 149 mg/dL   HDL 61 >13 mg/dL   VLDL Cholesterol Cal 10 5 - 40 mg/dL   LDL Chol Calc (NIH) 75 0 - 99 mg/dL  Hemoglobin Y8M     Status: Abnormal   Collection Time: 03/25/23  9:40 AM  Result Value Ref Range   Hgb A1c MFr Bld 6.2 (H) 4.8 - 5.6 %    Comment:          Prediabetes: 5.7 - 6.4          Diabetes: >6.4          Glycemic control for adults with diabetes: <7.0    Est. average glucose Bld gHb Est-mCnc 131 mg/dL  [id number]+NGEX     Status: Abnormal   Collection Time: 03/25/23  9:40 AM  Result Value Ref Range   Glucose 100 (H) 70 - 99 mg/dL   BUN 15 8 - 27 mg/dL   Creatinine, Ser 5.28 0.57 - 1.00 mg/dL   eGFR 63 >41 LK/GMW/1.02   BUN/Creatinine Ratio 16 12 - 28   Sodium 141 134 - 144 mmol/L   Potassium 4.8 3.5 - 5.2 mmol/L   Chloride 103 96 - 106 mmol/L   CO2 25 20 - 29 mmol/L   Calcium 9.2 8.7 - 10.3 mg/dL   Total Protein 6.3 6.0 - 8.5 g/dL   Albumin 4.2 3.8 -  4.8 g/dL   Globulin, Total 2.1 1.5 - 4.5 g/dL   Bilirubin Total 0.7 0.0 - 1.2 mg/dL   Alkaline Phosphatase 52 44 - 121 IU/L   AST 26 0 - 40 IU/L   ALT 43 (H) 0 - 32 IU/L  Uric  acid     Status: None   Collection Time: 03/25/23  9:40 AM  Result Value Ref Range   Uric Acid 5.9 3.1 - 7.9 mg/dL    Comment:            Therapeutic target for gout patients: <6.0  CBC with Diff     Status: None   Collection Time: 03/25/23  9:40 AM  Result Value Ref Range   WBC 5.3 3.4 - 10.8 x10E3/uL   RBC 4.46 3.77 - 5.28 x10E6/uL   Hemoglobin 12.6 11.1 - 15.9 g/dL   Hematocrit 47.4 25.9 - 46.6 %   MCV 90 79 - 97 fL   MCH 28.3 26.6 - 33.0 pg   MCHC 31.6 31.5 - 35.7 g/dL   RDW 56.3 87.5 - 64.3 %   Platelets 218 150 - 450 x10E3/uL   Neutrophils 69 Not Estab. %   Lymphs 21 Not Estab. %   Monocytes 7 Not Estab. %   Eos 3 Not Estab. %   Basos 0 Not Estab. %   Neutrophils Absolute 3.7 1.4 - 7.0 x10E3/uL   Lymphocytes Absolute 1.1 0.7 - 3.1 x10E3/uL   Monocytes Absolute 0.4 0.1 - 0.9 x10E3/uL   EOS (ABSOLUTE) 0.2 0.0 - 0.4 x10E3/uL   Basophils Absolute 0.0 0.0 - 0.2 x10E3/uL   Immature Granulocytes 0 Not Estab. %   Immature Grans (Abs) 0.0 0.0 - 0.1 x10E3/uL      Assessment & Plan:  Continue same medications.   Problem List Items Addressed This Visit       Other   Encounter for annual health examination - Primary    Return in about 3 months (around 06/25/2023) for with NK.   Total time spent: 25 minutes  [employer], NP  03/27/2023   This document may have been prepared by Dragon Voice Recognition software and as such may include unintentional dictation errors.

## 2023-04-01 ENCOUNTER — Ambulatory Visit: Payer: Medicare Other | Admitting: Cardiology

## 2023-04-22 DIAGNOSIS — E0520 Thyrotoxicosis with toxic multinodular goiter without thyrotoxic crisis or storm: Secondary | ICD-10-CM | POA: Diagnosis not present

## 2023-04-25 ENCOUNTER — Other Ambulatory Visit: Payer: Self-pay | Admitting: Internal Medicine

## 2023-04-25 DIAGNOSIS — I10 Essential (primary) hypertension: Secondary | ICD-10-CM

## 2023-04-29 DIAGNOSIS — E0520 Thyrotoxicosis with toxic multinodular goiter without thyrotoxic crisis or storm: Secondary | ICD-10-CM | POA: Diagnosis not present

## 2023-05-26 ENCOUNTER — Telehealth: Payer: Self-pay | Admitting: Internal Medicine

## 2023-05-26 NOTE — Telephone Encounter (Signed)
Patient left VM that this weekend overnight she was up vomiting and diarrhea for a few hours then dry heaving the rest of the night. No fever and feels much better now. She wants to know what could've caused this and if she needs to do anything. Please advise.

## 2023-06-23 ENCOUNTER — Other Ambulatory Visit: Payer: Self-pay | Admitting: Cardiology

## 2023-06-23 ENCOUNTER — Telehealth: Payer: Self-pay | Admitting: Internal Medicine

## 2023-06-23 MED ORDER — AZITHROMYCIN 250 MG PO TABS: ORAL_TABLET | ORAL | 0 refills | Status: AC

## 2023-06-23 NOTE — Telephone Encounter (Signed)
 Patient called in c/o cough and congestion. Has been taking OTC meds with no success. Denies fever, chills, or body aches. Would like a Z-pak sent in.  [facility] - [address]

## 2023-06-30 ENCOUNTER — Encounter: Payer: Self-pay | Admitting: Internal Medicine

## 2023-06-30 ENCOUNTER — Ambulatory Visit (INDEPENDENT_AMBULATORY_CARE_PROVIDER_SITE_OTHER): Payer: Medicare Other | Admitting: Internal Medicine

## 2023-06-30 VITALS — BP 136/88 | HR 66 | Ht 63.0 in | Wt 163.6 lb

## 2023-06-30 DIAGNOSIS — M109 Gout, unspecified: Secondary | ICD-10-CM | POA: Diagnosis not present

## 2023-06-30 DIAGNOSIS — R7303 Prediabetes: Secondary | ICD-10-CM

## 2023-06-30 DIAGNOSIS — I10 Essential (primary) hypertension: Secondary | ICD-10-CM | POA: Diagnosis not present

## 2023-06-30 DIAGNOSIS — E782 Mixed hyperlipidemia: Secondary | ICD-10-CM | POA: Diagnosis not present

## 2023-06-30 DIAGNOSIS — E0590 Thyrotoxicosis, unspecified without thyrotoxic crisis or storm: Secondary | ICD-10-CM | POA: Diagnosis not present

## 2023-06-30 NOTE — Progress Notes (Signed)
 Established Patient Office Visit  Subjective:  Patient ID: Laurie Spears, female    DOB: 06/04/1945  Age: 78 y.o. MRN: [id number]  Chief Complaint  Patient presents with   Follow-up    3 month follow up    Patient comes in for her follow-up today.  She reports of developing cold/flulike symptoms about 2 weeks ago.  Was taking over-the-counter medications until last week since it was not getting better and was prescribed a Z-Pak which she has completed.  Today she is not having any fevers or chills but still has some nasal congestion and occasional cough bringing up clear sputum.  Patient advised to continue her Flonase nasal spray as well as Allegra. She is fasting for blood work today.  Mentions pain in her right foot which lasted only 1 day, suspected a flareup of gout.  She has been taken off allopurinol since her uric acid was normal for a while.  Patient denies drinking extra alcohol or eating red meat.  Her thyroid is under good control as per her endocrinologist.      No other concerns at this time.   Past Medical History:  Diagnosis Date   Gouty arthritis    High cholesterol    Hypertension    Hyperthyroidism    Prediabetes     Past Surgical History:  Procedure Laterality Date   ABDOMINAL HYSTERECTOMY  1980   BREAST EXCISIONAL BIOPSY Bilateral yrs ago   benign    Social History   Socioeconomic History   Marital status: Divorced    Spouse name: Not on file   Number of children: Not on file   Years of education: Not on file   Highest education level: Not on file  Occupational History   Not on file  Tobacco Use   Smoking status: Former    Current packs/day: 0.00    Types: Cigarettes    Quit date: 06/26/1994    Years since quitting: 29.0   Smokeless tobacco: Not on file  Substance and Sexual Activity   Alcohol use: No    Alcohol/week: 0.0 standard drinks of alcohol   Drug use: Never   Sexual activity: Not on file  Other Topics Concern   Not on file   Social History Narrative   Not on file   Social Drivers of Health   Financial Resource Strain: Not on file  Food Insecurity: Not on file  Transportation Needs: Not on file  Physical Activity: Not on file  Stress: Not on file  Social Connections: Not on file  Intimate Partner Violence: Not on file    Family History  Problem Relation Age of Onset   Breast cancer Neg Hx     No Known Allergies  Outpatient Medications Prior to Visit  Medication Sig   alendronate (FOSAMAX) 70 MG tablet TAKE 1 TABLET BY MOUTH WEEKLY   Calcium Carb-Cholecalciferol (CALCIUM HIGH POTENCY/VITAMIN D) 600-5 MG-MCG TABS Take 1 tablet by mouth 2 (two) times daily.   losartan (COZAAR) 100 MG tablet TAKE 1 TABLET BY MOUTH DAILY   meloxicam (MOBIC) 15 MG tablet Take 1 tablet by mouth once daily   methimazole (TAPAZOLE) 5 MG tablet Take 5 mg by mouth.   rosuvastatin (CRESTOR) 40 MG tablet TAKE 1 TABLET BY MOUTH ONCE  DAILY   spironolactone (ALDACTONE) 25 MG tablet TAKE 1 TABLET BY MOUTH ONCE  DAILY   [DISCONTINUED] Loratadine 10 MG CAPS Take 10 mg by mouth.   allopurinol (ZYLOPRIM) 100 MG tablet TAKE 1  TABLET BY MOUTH ONCE  DAILY (Patient not taking: Reported on 06/30/2023)   No facility-administered medications prior to visit.    Review of Systems  Constitutional: Negative.  Negative for chills, fever, malaise/fatigue and weight loss.  HENT:  Positive for congestion. Negative for sinus pain and sore throat.   Eyes: Negative.   Respiratory:  Positive for cough. Negative for shortness of breath and wheezing.   Cardiovascular: Negative.  Negative for chest pain, palpitations and leg swelling.  Gastrointestinal: Negative.  Negative for abdominal pain, constipation, diarrhea, heartburn, nausea and vomiting.  Genitourinary: Negative.  Negative for dysuria and flank pain.  Musculoskeletal:  Positive for joint pain. Negative for myalgias.  Skin: Negative.   Neurological: Negative.  Negative for dizziness and  headaches.  Endo/Heme/Allergies: Negative.   Psychiatric/Behavioral: Negative.  Negative for depression and suicidal ideas. The patient is not nervous/anxious.        Objective:   BP 136/88   Pulse 66   Ht 5\' 3"  (1.6 m)   Wt 163 lb 9.6 oz (74.2 kg)   SpO2 99%   BMI 28.98 kg/m   Vitals:   06/30/23 1021  BP: 136/88  Pulse: 66  Height: 5\' 3"  (1.6 m)  Weight: 163 lb 9.6 oz (74.2 kg)  SpO2: 99%  BMI (Calculated): 28.99    Physical Exam Vitals and nursing note reviewed.  Constitutional:      Appearance: Normal appearance.  HENT:     Head: Normocephalic and atraumatic.     Nose: Nose normal.     Mouth/Throat:     Mouth: Mucous membranes are moist.     Pharynx: Oropharynx is clear.  Eyes:     Conjunctiva/sclera: Conjunctivae normal.     Pupils: Pupils are equal, round, and reactive to light.  Cardiovascular:     Rate and Rhythm: Normal rate and regular rhythm.     Pulses: Normal pulses.     Heart sounds: Normal heart sounds. No murmur heard. Pulmonary:     Effort: Pulmonary effort is normal.     Breath sounds: Normal breath sounds. No wheezing.  Abdominal:     General: Bowel sounds are normal.     Palpations: Abdomen is soft.     Tenderness: There is no abdominal tenderness. There is no right CVA tenderness or left CVA tenderness.  Musculoskeletal:        General: Normal range of motion.     Cervical back: Normal range of motion.     Right lower leg: No edema.     Left lower leg: No edema.  Skin:    General: Skin is warm and dry.  Neurological:     General: No focal deficit present.     Mental Status: She is alert and oriented to person, place, and time.  Psychiatric:        Mood and Affect: Mood normal.        Behavior: Behavior normal.      No results found for any visits on 06/30/23.  No results found for this or any previous visit (from the past 2160 hours).    Assessment & Plan:  Continue all medications.  Check labs today.  Follow-up in 10 days  to discuss results. Problem List Items Addressed This Visit     Essential hypertension, benign   Relevant Orders   CMP14+EGFR   Gouty arthritis - Primary   Relevant Orders   Uric acid   CBC with Diff   Mixed hyperlipidemia   Hyperthyroidism  Other Visit Diagnoses       Prediabetes       Relevant Orders   Hemoglobin A1c       Return in about 10 days (around 07/10/2023).   Total time spent: 30 minutes  Margaretann Loveless, MD  06/30/2023   This document may have been prepared by [facility] Voice Recognition software and as such may include unintentional dictation errors.

## 2023-07-01 LAB — CBC WITH DIFFERENTIAL/PLATELET
Basophils Absolute: 0 10*3/uL (ref 0.0–0.2)
Basos: 0 %
EOS (ABSOLUTE): 0.2 10*3/uL (ref 0.0–0.4)
Eos: 4 %
Hematocrit: 40.7 % (ref 34.0–46.6)
Hemoglobin: 13.2 g/dL (ref 11.1–15.9)
Immature Grans (Abs): 0 10*3/uL (ref 0.0–0.1)
Immature Granulocytes: 0 %
Lymphocytes Absolute: 1.5 10*3/uL (ref 0.7–3.1)
Lymphs: 31 %
MCH: 28.9 pg (ref 26.6–33.0)
MCHC: 32.4 g/dL (ref 31.5–35.7)
MCV: 89 fL (ref 79–97)
Monocytes Absolute: 0.4 10*3/uL (ref 0.1–0.9)
Monocytes: 7 %
Neutrophils Absolute: 2.7 10*3/uL (ref 1.4–7.0)
Neutrophils: 58 %
Platelets: 240 10*3/uL (ref 150–450)
RBC: 4.57 x10E6/uL (ref 3.77–5.28)
RDW: 13 % (ref 11.7–15.4)
WBC: 4.8 10*3/uL (ref 3.4–10.8)

## 2023-07-01 LAB — HEMOGLOBIN A1C
Est. average glucose Bld gHb Est-mCnc: 128 mg/dL
Hgb A1c MFr Bld: 6.1 % — ABNORMAL HIGH (ref 4.8–5.6)

## 2023-07-01 LAB — CMP14+EGFR
ALT: 36 IU/L — ABNORMAL HIGH (ref 0–32)
AST: 33 IU/L (ref 0–40)
Albumin: 4.2 g/dL (ref 3.8–4.8)
Alkaline Phosphatase: 64 IU/L (ref 44–121)
BUN/Creatinine Ratio: 10 — ABNORMAL LOW (ref 12–28)
BUN: 7 mg/dL — ABNORMAL LOW (ref 8–27)
Bilirubin Total: 0.6 mg/dL (ref 0.0–1.2)
CO2: 21 mmol/L (ref 20–29)
Calcium: 9.1 mg/dL (ref 8.7–10.3)
Chloride: 106 mmol/L (ref 96–106)
Creatinine, Ser: 0.69 mg/dL (ref 0.57–1.00)
Globulin, Total: 2.4 g/dL (ref 1.5–4.5)
Glucose: 101 mg/dL — ABNORMAL HIGH (ref 70–99)
Potassium: 4.4 mmol/L (ref 3.5–5.2)
Sodium: 143 mmol/L (ref 134–144)
Total Protein: 6.6 g/dL (ref 6.0–8.5)
eGFR: 90 mL/min/{1.73_m2} (ref >59)

## 2023-07-01 LAB — URIC ACID: Uric Acid: 5.1 mg/dL (ref 3.1–7.9)

## 2023-07-10 ENCOUNTER — Encounter: Payer: Self-pay | Admitting: Internal Medicine

## 2023-07-10 ENCOUNTER — Ambulatory Visit (INDEPENDENT_AMBULATORY_CARE_PROVIDER_SITE_OTHER): Admitting: Internal Medicine

## 2023-07-10 VITALS — BP 128/72 | HR 73 | Ht 63.0 in | Wt 164.6 lb

## 2023-07-10 DIAGNOSIS — E0590 Thyrotoxicosis, unspecified without thyrotoxic crisis or storm: Secondary | ICD-10-CM

## 2023-07-10 DIAGNOSIS — I10 Essential (primary) hypertension: Secondary | ICD-10-CM | POA: Diagnosis not present

## 2023-07-10 DIAGNOSIS — R7303 Prediabetes: Secondary | ICD-10-CM

## 2023-07-10 DIAGNOSIS — E782 Mixed hyperlipidemia: Secondary | ICD-10-CM

## 2023-07-10 NOTE — Progress Notes (Signed)
 Established Patient Office Visit  Subjective:  Patient ID: Laurie Spears, female    DOB: [birth year]  Age: 78 y.o. MRN: [id number]  Chief Complaint  Patient presents with   Follow-up    10 day follow up    Patient comes in for her follow-up today.  Her blood pressure is looking much better.  And she also did not have any further foot pain.  Her labs were essentially normal as well as her uric acid levels.  She is not taking allopurinol anymore.  Plan to stay off it but be careful with her diet.  Patient advised to continue rest of her medications.    No other concerns at this time.   Past Medical History:  Diagnosis Date   Gouty arthritis    High cholesterol    Hypertension    Hyperthyroidism    Prediabetes     Past Surgical History:  Procedure Laterality Date   ABDOMINAL HYSTERECTOMY  1980   BREAST EXCISIONAL BIOPSY Bilateral yrs ago   benign    Social History   Socioeconomic History   Marital status: Divorced    Spouse name: Not on file   Number of children: Not on file   Years of education: Not on file   Highest education level: Not on file  Occupational History   Not on file  Tobacco Use   Smoking status: Former    Current packs/day: 0.00    Types: Cigarettes    Quit date: 06/26/1994    Years since quitting: 29.0   Smokeless tobacco: Not on file  Substance and Sexual Activity   Alcohol use: No    Alcohol/week: 0.0 standard drinks of alcohol   Drug use: Never   Sexual activity: Not on file  Other Topics Concern   Not on file  Social History Narrative   Not on file   Social Drivers of Health   Financial Resource Strain: Not on file  Food Insecurity: Not on file  Transportation Needs: Not on file  Physical Activity: Not on file  Stress: Not on file  Social Connections: Not on file  Intimate Partner Violence: Not on file    Family History  Problem Relation Age of Onset   Breast cancer Neg Hx     No Known Allergies  Outpatient Medications  Prior to Visit  Medication Sig   alendronate (FOSAMAX) 70 MG tablet TAKE 1 TABLET BY MOUTH WEEKLY   Calcium Carb-Cholecalciferol (CALCIUM HIGH POTENCY/VITAMIN D) 600-5 MG-MCG TABS Take 1 tablet by mouth 2 (two) times daily.   losartan (COZAAR) 100 MG tablet TAKE 1 TABLET BY MOUTH DAILY   meloxicam (MOBIC) 15 MG tablet Take 1 tablet by mouth once daily   methimazole (TAPAZOLE) 5 MG tablet Take 5 mg by mouth.   rosuvastatin (CRESTOR) 40 MG tablet TAKE 1 TABLET BY MOUTH ONCE  DAILY   spironolactone (ALDACTONE) 25 MG tablet TAKE 1 TABLET BY MOUTH ONCE  DAILY   allopurinol (ZYLOPRIM) 100 MG tablet TAKE 1 TABLET BY MOUTH ONCE  DAILY (Patient not taking: Reported on 07/10/2023)   No facility-administered medications prior to visit.    Review of Systems  Constitutional: Negative.  Negative for chills, fever, malaise/fatigue and weight loss.  HENT: Negative.    Eyes: Negative.   Respiratory: Negative.  Negative for cough and shortness of breath.   Cardiovascular: Negative.  Negative for chest pain, palpitations and leg swelling.  Gastrointestinal: Negative.  Negative for abdominal pain, constipation, diarrhea, heartburn, nausea and vomiting.  Genitourinary: Negative.  Negative for dysuria and flank pain.  Musculoskeletal: Negative.  Negative for joint pain and myalgias.  Skin: Negative.   Neurological: Negative.  Negative for dizziness and headaches.  Endo/Heme/Allergies: Negative.   Psychiatric/Behavioral: Negative.  Negative for depression and suicidal ideas. The patient is not nervous/anxious.        Objective:   BP 128/72   Pulse 73   Ht 5\' 3"  (1.6 m)   Wt 164 lb 9.6 oz (74.7 kg)   SpO2 99%   BMI 29.16 kg/m   Vitals:   07/10/23 1400  BP: 128/72  Pulse: 73  Height: 5\' 3"  (1.6 m)  Weight: 164 lb 9.6 oz (74.7 kg)  SpO2: 99%  BMI (Calculated): 29.16    Physical Exam Vitals and nursing note reviewed.  Constitutional:      Appearance: Normal appearance.  HENT:     Head:  Normocephalic and atraumatic.     Nose: Nose normal.     Mouth/Throat:     Mouth: Mucous membranes are moist.     Pharynx: Oropharynx is clear.  Eyes:     Conjunctiva/sclera: Conjunctivae normal.     Pupils: Pupils are equal, round, and reactive to light.  Cardiovascular:     Rate and Rhythm: Normal rate and regular rhythm.     Pulses: Normal pulses.     Heart sounds: Normal heart sounds. No murmur heard. Pulmonary:     Effort: Pulmonary effort is normal.     Breath sounds: Normal breath sounds. No wheezing.  Abdominal:     General: Bowel sounds are normal.     Palpations: Abdomen is soft.     Tenderness: There is no abdominal tenderness. There is no right CVA tenderness or left CVA tenderness.  Musculoskeletal:        General: Normal range of motion.     Cervical back: Normal range of motion.     Right lower leg: No edema.     Left lower leg: No edema.  Skin:    General: Skin is warm and dry.  Neurological:     General: No focal deficit present.     Mental Status: She is alert and oriented to person, place, and time.  Psychiatric:        Mood and Affect: Mood normal.        Behavior: Behavior normal.      No results found for any visits on 07/10/23.  Recent Results (from the past 2160 hours)  Uric acid     Status: None   Collection Time: 06/30/23 11:14 AM  Result Value Ref Range   Uric Acid 5.1 3.1 - 7.9 mg/dL    Comment:            Therapeutic target for gout patients: <6.0  CMP14+EGFR     Status: Abnormal   Collection Time: 06/30/23 11:14 AM  Result Value Ref Range   Glucose 101 (H) 70 - 99 mg/dL   BUN 7 (L) 8 - 27 mg/dL   Creatinine, Ser 1.61 0.57 - 1.00 mg/dL   eGFR 90 >09 UE/AVW/0.98   BUN/Creatinine Ratio 10 (L) 12 - 28   Sodium 143 134 - 144 mmol/L   Potassium 4.4 3.5 - 5.2 mmol/L   Chloride 106 96 - 106 mmol/L   CO2 21 20 - 29 mmol/L   Calcium 9.1 8.7 - 10.3 mg/dL   Total Protein 6.6 6.0 - 8.5 g/dL   Albumin 4.2 3.8 - 4.8 g/dL   Globulin, Total  2.4  1.5 - 4.5 g/dL   Bilirubin Total 0.6 0.0 - 1.2 mg/dL   Alkaline Phosphatase 64 44 - 121 IU/L   AST 33 0 - 40 IU/L   ALT 36 (H) 0 - 32 IU/L  CBC with Diff     Status: None   Collection Time: 06/30/23 11:14 AM  Result Value Ref Range   WBC 4.8 3.4 - 10.8 x10E3/uL   RBC 4.57 3.77 - 5.28 x10E6/uL   Hemoglobin 13.2 11.1 - 15.9 g/dL   Hematocrit 40.9 81.1 - 46.6 %   MCV 89 79 - 97 fL   MCH 28.9 26.6 - 33.0 pg   MCHC 32.4 31.5 - 35.7 g/dL   RDW 91.4 78.2 - 95.6 %   Platelets 240 150 - 450 x10E3/uL   Neutrophils 58 Not Estab. %   Lymphs 31 Not Estab. %   Monocytes 7 Not Estab. %   Eos 4 Not Estab. %   Basos 0 Not Estab. %   Neutrophils Absolute 2.7 1.4 - 7.0 x10E3/uL   Lymphocytes Absolute 1.5 0.7 - 3.1 x10E3/uL   Monocytes Absolute 0.4 0.1 - 0.9 x10E3/uL   EOS (ABSOLUTE) 0.2 0.0 - 0.4 x10E3/uL   Basophils Absolute 0.0 0.0 - 0.2 x10E3/uL   Immature Granulocytes 0 Not Estab. %   Immature Grans (Abs) 0.0 0.0 - 0.1 x10E3/uL  Hemoglobin A1c     Status: Abnormal   Collection Time: 06/30/23 11:14 AM  Result Value Ref Range   Hgb A1c MFr Bld 6.1 (H) 4.8 - 5.6 %    Comment:          Prediabetes: 5.7 - 6.4          Diabetes: >6.4          Glycemic control for adults with diabetes: <7.0    Est. average glucose Bld gHb Est-mCnc 128 mg/dL      Assessment & Plan:  Patient advised to continue all her medications.  Will return for follow-up in 4 months. Problem List Items Addressed This Visit     Essential hypertension, benign - Primary   Mixed hyperlipidemia   Hyperthyroidism   Other Visit Diagnoses       Prediabetes           Return in about 4 months (around 11/09/2023).   Total time spent: 25 minutes  Margaretann Loveless, MD  07/10/2023   This document may have been prepared by [facility] Voice Recognition software and as such may include unintentional dictation errors.

## 2023-07-11 ENCOUNTER — Ambulatory Visit: Admitting: Internal Medicine

## 2023-07-14 DIAGNOSIS — M170 Bilateral primary osteoarthritis of knee: Secondary | ICD-10-CM | POA: Diagnosis not present

## 2023-08-06 ENCOUNTER — Other Ambulatory Visit: Payer: Self-pay | Admitting: Family

## 2023-08-12 ENCOUNTER — Other Ambulatory Visit: Payer: Self-pay | Admitting: Family

## 2023-09-04 ENCOUNTER — Other Ambulatory Visit: Payer: Self-pay | Admitting: Internal Medicine

## 2023-09-05 ENCOUNTER — Telehealth: Payer: Self-pay | Admitting: Internal Medicine

## 2023-09-05 ENCOUNTER — Other Ambulatory Visit: Payer: Self-pay | Admitting: Internal Medicine

## 2023-09-05 DIAGNOSIS — H43813 Vitreous degeneration, bilateral: Secondary | ICD-10-CM | POA: Diagnosis not present

## 2023-09-05 DIAGNOSIS — H2000 Unspecified acute and subacute iridocyclitis: Secondary | ICD-10-CM | POA: Diagnosis not present

## 2023-09-05 DIAGNOSIS — Z961 Presence of intraocular lens: Secondary | ICD-10-CM | POA: Diagnosis not present

## 2023-09-05 DIAGNOSIS — H5712 Ocular pain, left eye: Secondary | ICD-10-CM

## 2023-09-05 NOTE — Telephone Encounter (Signed)
 Patient called in c/o left eye redness and pain. Denies itchy eyes. Started yesterday when she woke up. Has not tried any eye drops yet. Please advise on what she should do. Does not have a optometrist.

## 2023-09-12 DIAGNOSIS — Z961 Presence of intraocular lens: Secondary | ICD-10-CM | POA: Diagnosis not present

## 2023-09-12 DIAGNOSIS — H2000 Unspecified acute and subacute iridocyclitis: Secondary | ICD-10-CM | POA: Diagnosis not present

## 2023-10-13 DIAGNOSIS — H2000 Unspecified acute and subacute iridocyclitis: Secondary | ICD-10-CM | POA: Diagnosis not present

## 2023-10-20 ENCOUNTER — Emergency Department
Admission: EM | Admit: 2023-10-20 | Discharge: 2023-10-20 | Disposition: A | Discharge status: Home / Self Care | Attending: Emergency Medicine | Admitting: Emergency Medicine

## 2023-10-20 ENCOUNTER — Encounter: Payer: Self-pay | Admitting: Emergency Medicine

## 2023-10-20 ENCOUNTER — Other Ambulatory Visit: Payer: Self-pay

## 2023-10-20 ENCOUNTER — Ambulatory Visit (INDEPENDENT_AMBULATORY_CARE_PROVIDER_SITE_OTHER)

## 2023-10-20 ENCOUNTER — Ambulatory Visit
Admission: EM | Admit: 2023-10-20 | Discharge: 2023-10-20 | Disposition: A | Discharge status: Home / Self Care | Attending: Emergency Medicine | Admitting: Emergency Medicine

## 2023-10-20 DIAGNOSIS — W19XXXA Unspecified fall, initial encounter: Secondary | ICD-10-CM | POA: Diagnosis not present

## 2023-10-20 DIAGNOSIS — M25532 Pain in left wrist: Secondary | ICD-10-CM | POA: Insufficient documentation

## 2023-10-20 DIAGNOSIS — R55 Syncope and collapse: Secondary | ICD-10-CM | POA: Diagnosis not present

## 2023-10-20 DIAGNOSIS — I10 Essential (primary) hypertension: Secondary | ICD-10-CM | POA: Insufficient documentation

## 2023-10-20 DIAGNOSIS — M1812 Unilateral primary osteoarthritis of first carpometacarpal joint, left hand: Secondary | ICD-10-CM | POA: Diagnosis not present

## 2023-10-20 DIAGNOSIS — R112 Nausea with vomiting, unspecified: Secondary | ICD-10-CM | POA: Diagnosis not present

## 2023-10-20 DIAGNOSIS — R1084 Generalized abdominal pain: Secondary | ICD-10-CM | POA: Diagnosis not present

## 2023-10-20 LAB — COMPREHENSIVE METABOLIC PANEL WITH GFR
ALT: 28 U/L (ref 0–44)
AST: 24 U/L (ref 15–41)
Albumin: 3.5 g/dL (ref 3.5–5.0)
Alkaline Phosphatase: 35 U/L — ABNORMAL LOW (ref 38–126)
Anion gap: 9 (ref 5–15)
BUN: 15 mg/dL (ref 8–23)
CO2: 24 mmol/L (ref 22–32)
Calcium: 8.4 mg/dL — ABNORMAL LOW (ref 8.9–10.3)
Chloride: 108 mmol/L (ref 98–111)
Creatinine, Ser: 0.87 mg/dL (ref 0.44–1.00)
GFR, Estimated: >60 mL/min (ref >60)
Glucose, Bld: 119 mg/dL — ABNORMAL HIGH (ref 70–99)
Potassium: 3.6 mmol/L (ref 3.5–5.1)
Sodium: 141 mmol/L (ref 135–145)
Total Bilirubin: 0.9 mg/dL (ref 0.0–1.2)
Total Protein: 5.9 g/dL — ABNORMAL LOW (ref 6.5–8.1)

## 2023-10-20 LAB — CBC
HCT: 35.5 % — ABNORMAL LOW (ref 36.0–46.0)
Hemoglobin: 11.8 g/dL — ABNORMAL LOW (ref 12.0–15.0)
MCH: 29.1 pg (ref 26.0–34.0)
MCHC: 33.2 g/dL (ref 30.0–36.0)
MCV: 87.7 fL (ref 80.0–100.0)
Platelets: 181 10*3/uL (ref 150–400)
RBC: 4.05 MIL/uL (ref 3.87–5.11)
RDW: 13.4 % (ref 11.5–15.5)
WBC: 6 10*3/uL (ref 4.0–10.5)
nRBC: 0 % (ref 0.0–0.2)

## 2023-10-20 LAB — TROPONIN I (HIGH SENSITIVITY): Troponin I (High Sensitivity): <2 ng/L (ref <18)

## 2023-10-20 LAB — GLUCOSE, CAPILLARY: Glucose-Capillary: 108 mg/dL — ABNORMAL HIGH (ref 70–99)

## 2023-10-20 MED ORDER — MORPHINE SULFATE (PF) 4 MG/ML IV SOLN
4.0000 mg | Freq: Once | INTRAVENOUS | Status: AC
  Administered 2023-10-20: 4 mg via INTRAVENOUS
  Filled 2023-10-20: qty 1

## 2023-10-20 MED ORDER — SODIUM CHLORIDE 0.9 % IV BOLUS
1000.0000 mL | Freq: Once | INTRAVENOUS | Status: AC
  Administered 2023-10-20: 1000 mL via INTRAVENOUS

## 2023-10-20 MED ORDER — OXYCODONE-ACETAMINOPHEN 5-325 MG PO TABS: 1.0000 | ORAL_TABLET | ORAL | 0 refills | Status: AC | PRN

## 2023-10-20 MED ORDER — ONDANSETRON HCL 4 MG/2ML IJ SOLN
4.0000 mg | Freq: Once | INTRAMUSCULAR | Status: AC
  Administered 2023-10-20: 4 mg via INTRAVENOUS
  Filled 2023-10-20: qty 2

## 2023-10-20 NOTE — ED Notes (Signed)
 Patient is being discharged from the Urgent Care and sent to the Emergency Department via EMS . Per Dr.Mortenson, patient is in need of higher level of care due to syncope episode x 2, vomiting. Patient is aware and verbalizes understanding of plan of care.  Vitals:   10/20/23 1928 10/20/23 2003  BP: (!) 144/88 138/86  Pulse: 64   Resp: 16   Temp: 98.2 F (36.8 C)   SpO2: 100%

## 2023-10-20 NOTE — ED Notes (Signed)
 Pt found to be 87% on room air. Pt placed on 2L at this time.

## 2023-10-20 NOTE — ED Provider Notes (Signed)
 [facility] Provider Note    Event Date/Time   First MD Initiated Contact with Patient 10/20/23 2121     (approximate)  History   Chief Complaint: Loss of Consciousness (x2)  HPI  Laurie Spears is a 78 y.o. female with a past medical history of hypertension, hyperlipidemia, gouty arthritis, presents to the emergency department after a syncopal episode at urgent care earlier today.  According to the patient and record review patient was at urgent care getting x-rays performed.  They had to manipulate the wrist and patient states significant pain.  Patient became diaphoretic and had a brief syncopal event.  Patient was then wheeled back into the room shortly after that and again had another syncopal event.  Since arriving to the emergency department here the patient appears well.  She continues to complain of significant pain in the left wrist.  I evaluated the left wrist.  Patient has significant tenderness to the distal radius and within the snuffbox concerning for likely scaphoid fracture.  Patient presented in a removable Velcro splint.  Patient denies any chest pain.  No shortness of breath.  No headache.  Physical Exam   Triage Vital Signs: ED Triage Vitals  Encounter Vitals Group     BP 10/20/23 2123 134/60     Girls Systolic BP Percentile --      Girls Diastolic BP Percentile --      Boys Systolic BP Percentile --      Boys Diastolic BP Percentile --      Pulse Rate 10/20/23 2123 64     Resp 10/20/23 2123 16     Temp 10/20/23 2123 97.8 F (36.6 C)     Temp src --      SpO2 10/20/23 2114 98 %     Weight 10/20/23 2136 171 lb 6.4 oz (77.7 kg)     Height 10/20/23 2136 5' 3 (1.6 m)     Head Circumference --      Peak Flow --      Pain Score 10/20/23 2124 10     Pain Loc --      Pain Education --      Exclude from Growth Chart --     Most recent vital signs: Vitals:   10/20/23 2143 10/20/23 2146  BP:    Pulse: (!) 57 (!) 56  Resp: (!) 21 16   Temp:    SpO2: (!) 86% 95%    General: Awake, no distress.  CV:  Good peripheral perfusion.  Regular rate and rhythm  Resp:  Normal effort.  Equal breath sounds bilaterally.  Abd:  No distention.  Soft, nontender.  Other:  Patient has significant tenderness to the distal radius of the left wrist with tenderness over the snuffbox.  Neurovascularly intact distally.  ED Results / Procedures / Treatments   EKG  EKG viewed and interpreted by myself shows a sinus rhythm at 52 bpm with a narrow QRS, normal axis, normal intervals, no concerning ST changes.  RADIOLOGY  Patient's x-rays performed urgent care of the left hand and wrist are read as negative by radiology.   MEDICATIONS ORDERED IN ED: Medications  morphine (PF) 4 MG/ML injection 4 mg (has no administration in time range)  ondansetron  (ZOFRAN ) injection 4 mg (has no administration in time range)  sodium chloride 0.9 % bolus 1,000 mL (has no administration in time range)     IMPRESSION / MDM / ASSESSMENT AND PLAN / ED COURSE  I reviewed  the triage vital signs and the nursing notes.  Patient's presentation is most consistent with acute presentation with potential threat to life or bodily function.  Patient presents to the emergency department for 2 syncopal events occurring shortly after [place] while at urgent care.  First occurred while manipulating the right wrist for x-ray imaging.  Highly suspect more vagal induced syncopal episodes.  However as a precaution we will check labs including a CBC chemistry and troponin.  Will obtain an EKG.  Will IV hydrate.  Patient continues to complain of significant pain to the left wrist.  X-ray is read as negative by radiology.  However given the patient's significant tenderness of the left snuffbox we will place the patient in a left thumb spica splint, have the patient follow-up with orthopedics for repeat x-ray in 1 week.  Patient's CBC is reassuring, chemistry reassuring.  Troponin  pending.  EKG shows no concerning findings.  Patient continues to appear well in the emergency department.  Patient's troponin is negative.  Patient is feeling well.  No complaints.  Highly suspect vasovagal event.  Given the patient's reassuring workup we will discharge the patient home.  I have evaluated the splint after it has been placed, remains neurovascularly intact.  Patient will follow-up with orthopedics in 1 week.  FINAL CLINICAL IMPRESSION(S) / ED DIAGNOSES   Left wrist pain Vasovagal syncope  Note:  This document was prepared using Dragon voice recognition software and may include unintentional dictation errors.   Dorothyann Drivers, MD 10/20/23 2306

## 2023-10-20 NOTE — ED Triage Notes (Addendum)
 Pt arrives via ems from [facility]. Pt had two syncopal episodes w/ n/v while there. Pt was being seen for left wrist injury. Pt arrives to ER a&ox4 c/o nausea at this time.  Pt received 4 mg of zofran  and 50 mcg of fentanyl en route.

## 2023-10-20 NOTE — ED Triage Notes (Signed)
 Pt tripped and fell today. She is having left wrist and hand pain.

## 2023-10-20 NOTE — Discharge Instructions (Addendum)
 Please call the number provided for orthopedics arrange a follow-up appointment in 1 week for recheck of your left wrist.  Please take your pain medication as needed but only as prescribed.  Do not drink alcohol or drive while taking pain medication.  Patient plenty of fluids.  Return to the emergency department for any symptom concerning to yourself.

## 2023-10-20 NOTE — ED Provider Notes (Signed)
 HPI  SUBJECTIVE:  Laurie Spears is a right-handed 78 y.o. female who presents with left dorsal and radial wrist pain after having a trip and fall on concrete earlier today.  She is not sure exactly how she fell onto her wrist/hand.  She denies immediate pain, but went home, rested.  When she woke up, she states that she had pain, swelling in her wrist.  She reports limited motion at the wrist, but is able to move her fingers without any problem.  No numbness or tingling in her fingers.  She denies preceding chest pain, shortness of breath, palpitations, hitting her head, syncope causing her fall.  No amnesia to the event.  She has a past medical history of hypertension, hypercholesterolemia, osteoporosis and seizures per family Laurie Spears has no formal diagnosis of epilepsy.  She is not on any anticonvulsants.  No history of syncope.  While getting her wrist x-rayed, patient reports nausea, lightheadedness.  She became diaphoretic, became unresponsive for several seconds with several tonic/clonic jerking movements, then returned rapidly to consciousness with no confusion, followed by multiple episodes of vomiting.  This occurred once more here in the department when her wrist was not being manipulated.  She states that she has been constantly feeling nauseous since getting here.  Denies chest pain, headache, shortness of breath, abdominal pain, palpitations.    Past Medical History:  Diagnosis Date   Gouty arthritis    High cholesterol    Hypertension    Hyperthyroidism    Prediabetes     Past Surgical History:  Procedure Laterality Date   ABDOMINAL HYSTERECTOMY  1980   BREAST EXCISIONAL BIOPSY Bilateral yrs ago   benign    Family History  Problem Relation Age of Onset   Breast cancer Neg Hx     Social History   Tobacco Use   Smoking status: Former    Current packs/day: 0.00    Types: Cigarettes    Quit date: 06/26/1994    Years since quitting: 29.3  Vaping Use   Vaping  status: Never Used  Substance Use Topics   Alcohol use: No    Alcohol/week: 0.0 standard drinks of alcohol   Drug use: Never    No current facility-administered medications for this encounter.  Current Outpatient Medications:    alendronate (FOSAMAX) 70 MG tablet, TAKE 1 TABLET BY MOUTH WEEKLY  WITH 8 OZ OF PLAIN WATER 30  MINUTES BEFORE FIRST FOOD, DRINK OR MEDS. STAY UPRIGHT FOR 30  MINS, Disp: 12 tablet, Rfl: 3   allopurinol (ZYLOPRIM) 100 MG tablet, TAKE 1 TABLET BY MOUTH ONCE  DAILY (Patient not taking: Reported on 07/10/2023), Disp: 100 tablet, Rfl: 2   Calcium Carb-Cholecalciferol (CALCIUM HIGH POTENCY/VITAMIN D) 600-5 MG-MCG TABS, Take 1 tablet by mouth 2 (two) times daily., Disp: , Rfl:    losartan (COZAAR) 100 MG tablet, TAKE 1 TABLET BY MOUTH DAILY, Disp: 100 tablet, Rfl: 2   meloxicam (MOBIC) 15 MG tablet, Take 1 tablet by mouth once daily, Disp: 30 tablet, Rfl: 3   methimazole (TAPAZOLE) 5 MG tablet, Take 5 mg by mouth., Disp: , Rfl:    rosuvastatin (CRESTOR) 40 MG tablet, TAKE 1 TABLET BY MOUTH ONCE  DAILY, Disp: 100 tablet, Rfl: 2   spironolactone (ALDACTONE) 25 MG tablet, TAKE 1 TABLET BY MOUTH ONCE  DAILY, Disp: 100 tablet, Rfl: 2  No Known Allergies   ROS  As noted in HPI.   Physical Exam  BP 138/86 (BP Location: Left Arm)   Pulse  64   Temp 98.2 F (36.8 C) (Oral)   Resp 16   SpO2 100%   Constitutional: Well developed, well nourished, no acute distress Eyes:  EOMI, conjunctiva normal bilaterally HENT: Normocephalic, atraumatic,mucus membranes moist Respiratory: Normal inspiratory effort Cardiovascular: Normal rate, regular rhythm, no murmurs rubs or gallops GI: nondistended skin: No rash, skin intact Musculoskeletal: L  distal radius NT, distal ulnar styloid NT, snuffbox tender, swelling, tenderness dorsum of wrist, carpals tender, metacarpals NT , digits NT, TFCC NT.  no pain with supination,  no pain with pronation, no pain with radial / ulnar deviation.   Pain with flexion/extension.  Motor intact in the median/radial/ulnar distribution, Sensation LT to hand normal. Rp 2+.  No bruising, erythema, Elbow and proximal forearm NT.  Neurologic: Alert & oriented x 3, no focal neuro deficits Psychiatric: Speech and behavior appropriate   ED Course   Medications - No data to display  Orders Placed This Encounter  Procedures   DG Hand Complete Left    Standing Status:   Standing    Number of Occurrences:   1    Reason for Exam (SYMPTOM  OR DIAGNOSIS REQUIRED):   Fall   DG Wrist Complete Left    Standing Status:   Standing    Number of Occurrences:   1    Reason for Exam (SYMPTOM  OR DIAGNOSIS REQUIRED):   Fall   Glucose, capillary    Standing Status:   Standing    Number of Occurrences:   1    Results for orders placed or performed during the hospital encounter of 10/20/23 (from the past 24 hours)  Glucose, capillary     Status: Abnormal   Collection Time: 10/20/23  8:08 PM  Result Value Ref Range   Glucose-Capillary 108 (H) 70 - 99 mg/dL   DG Hand Complete Left Result Date: 10/20/2023 CLINICAL DATA:  Tripped and fell today.  Left wrist and hand pain. EXAM: LEFT HAND - COMPLETE 3+ VIEW; LEFT WRIST - COMPLETE 3+ VIEW COMPARISON:  None Available. FINDINGS: There is no evidence of fracture or dislocation. Degenerative arthritis first CMC joint. Soft tissues are unremarkable. IMPRESSION: No acute fracture or dislocation. Electronically Signed   By: Norman Gatlin M.D.   On: 10/20/2023 19:52   DG Wrist Complete Left Result Date: 10/20/2023 CLINICAL DATA:  Tripped and fell today.  Left wrist and hand pain. EXAM: LEFT HAND - COMPLETE 3+ VIEW; LEFT WRIST - COMPLETE 3+ VIEW COMPARISON:  None Available. FINDINGS: There is no evidence of fracture or dislocation. Degenerative arthritis first CMC joint. Soft tissues are unremarkable. IMPRESSION: No acute fracture or dislocation. Electronically Signed   By: Norman Gatlin M.D.   On: 10/20/2023 19:52     ED Clinical Impression  1. Syncope, unspecified syncope type   2. Left wrist pain   3. Fall, initial encounter      ED Assessment/Plan     Reviewed imaging independently.  No fracture of the wrist or hand.  See radiology report for full details.  Patient had 2 episodes where she became nauseous, lightheaded, diaphoretic, and then became unresponsive for several seconds, with eyes open, and had several tonic/clonic movements followed by multiple episodes of emesis.  She had rapid return to consciousness without any confusion or amnesia.  The first episode was after she had her wrist x-rayed, where she states that her wrist started to hurt.  However, prior to the second episode, there was no manipulation of the wrist.  She denies  preceding headache, palpitations, chest pain, shortness of breath, tunnel vision, tinnitus both times.  Suspect vasovagal syncope, however, this occurred twice, and given her age, concern for more emergent cause of her syncope.  No abdominal pain.  Fingerstick 108  EKG #1, normal sinus rhythm, rate 65.  Normal axis, normal intervals.  No hypertrophy.  No ST-T wave changes. EKG #2, normal sinus rhythm, rate 65, no interval changes.  These were obtained shortly after patient had first episode of syncope.  Transferring to the emergency department via EMS for evaluation of syncope-she continued to feel poorly after the initial syncopal episode.  Wrist was immobilized in a thumb spica splint.  Gave report to EMS and [facility] charge RN.   I also suspect an occult scaphoid fracture given scaphoid tenderness and dorsal wrist swelling, tenderness even though x-rays are negative.  Discussed this with family members.  Advised orthopedic follow-up in 7 to 10 days for repeat x-rays to rule out scaphoid fracture.  They agree with plan.  No orders of the defined types were placed in this encounter.     *This clinic note was created using Dragon dictation software.  Therefore, there may be occasional mistakes despite careful proofreading.  ?    Van Knee, MD 10/20/23 2046

## 2023-10-27 ENCOUNTER — Telehealth: Payer: Self-pay | Admitting: Internal Medicine

## 2023-10-27 NOTE — Telephone Encounter (Signed)
 Patient left VM informing us  that she fell a week ago and hurt her wrist and went to the urgent care. They wrapped it and referred her to ortho, who she has an appt with this Friday. She also said she was taken by the rescue unit for syncope episodes and was told to follow up with her PCP. She has an appt on 7/21 with you but wants to know if she should move that appt up sooner? States she stayed in bed all last week but feels better now.

## 2023-10-28 ENCOUNTER — Ambulatory Visit: Payer: Self-pay | Admitting: Internal Medicine

## 2023-10-28 ENCOUNTER — Ambulatory Visit (INDEPENDENT_AMBULATORY_CARE_PROVIDER_SITE_OTHER): Admitting: Internal Medicine

## 2023-10-28 ENCOUNTER — Encounter: Payer: Self-pay | Admitting: Internal Medicine

## 2023-10-28 ENCOUNTER — Ambulatory Visit
Admission: RE | Admit: 2023-10-28 | Discharge: 2023-10-28 | Disposition: A | Discharge status: Home / Self Care | Source: Ambulatory Visit | Attending: Internal Medicine | Admitting: Internal Medicine

## 2023-10-28 VITALS — BP 138/66 | HR 75 | Ht 63.0 in | Wt 160.6 lb

## 2023-10-28 DIAGNOSIS — I10 Essential (primary) hypertension: Secondary | ICD-10-CM

## 2023-10-28 DIAGNOSIS — R569 Unspecified convulsions: Secondary | ICD-10-CM

## 2023-10-28 DIAGNOSIS — R7303 Prediabetes: Secondary | ICD-10-CM

## 2023-10-28 DIAGNOSIS — E782 Mixed hyperlipidemia: Secondary | ICD-10-CM

## 2023-10-28 DIAGNOSIS — E0520 Thyrotoxicosis with toxic multinodular goiter without thyrotoxic crisis or storm: Secondary | ICD-10-CM | POA: Diagnosis not present

## 2023-10-28 DIAGNOSIS — I6782 Cerebral ischemia: Secondary | ICD-10-CM | POA: Diagnosis not present

## 2023-10-28 DIAGNOSIS — R55 Syncope and collapse: Secondary | ICD-10-CM

## 2023-10-28 DIAGNOSIS — E0590 Thyrotoxicosis, unspecified without thyrotoxic crisis or storm: Secondary | ICD-10-CM

## 2023-10-28 MED ORDER — IOHEXOL 300 MG/ML  SOLN
75.0000 mL | Freq: Once | INTRAMUSCULAR | Status: AC | PRN
  Administered 2023-10-28: 75 mL via INTRAVENOUS

## 2023-10-28 NOTE — Progress Notes (Signed)
 Established Patient Office Visit  Subjective:  Patient ID: Laurie Spears, female    DOB: 1946/03/14  Age: 78 y.o. MRN: [id number]  Chief Complaint  Patient presents with   Follow-up    4 month follow up    Patient comes in for a follow up visit from an ED  visit on 10/20/2023.  Prior to that she went to the urgent care where she reported of a fall from tripping on concrete and injuring her left wrist.  She was in significant pain and while she was being x-rayed and her wrist was being manipulated she became diaphoretic and became an unconscious with several tonic and clonic involuntary movements.  She recovered and had several bouts of vomiting after that.  She had another similar episode while she was still in the urgent care.  Thereafter she was shifted to [facility] emergency room .  Upon arrival to ED she continued to complain of pain in her left wrist but did not have any further episodes of nausea or vomiting or seizure-like activity.  Patient reports that she does not have a history of seizure disorder but apparently her daughter had witnessed a similar episode about a year ago.  In the ED she was stable and was discharged with a diagnosis of vasovagal syncope.  She continued to have severe pain in her left wrist, although the x-ray was negative but she was put on a splint with an appointment to see an orthopedic later on this week. Today patient her left wrist pain has improved but she still feels mild dizziness and brain fog. Will get a stat CT of the head with contrast, neurology referral and EEG.    No other concerns at this time.   Past Medical History:  Diagnosis Date   Gouty arthritis    High cholesterol    Hypertension    Hyperthyroidism    Prediabetes     Past Surgical History:  Procedure Laterality Date   ABDOMINAL HYSTERECTOMY  1980   BREAST EXCISIONAL BIOPSY Bilateral yrs ago   benign    Social History   Socioeconomic History   Marital status: Divorced    Spouse  name: Not on file   Number of children: Not on file   Years of education: Not on file   Highest education level: Not on file  Occupational History   Not on file  Tobacco Use   Smoking status: Former    Current packs/day: 0.00    Types: Cigarettes    Quit date: 06/26/1994    Years since quitting: 29.3   Smokeless tobacco: Not on file  Vaping Use   Vaping status: Never Used  Substance and Sexual Activity   Alcohol use: No    Alcohol/week: 0.0 standard drinks of alcohol   Drug use: Never   Sexual activity: Not on file  Other Topics Concern   Not on file  Social History Narrative   Not on file   Social Drivers of Health   Financial Resource Strain: Not on file  Food Insecurity: Not on file  Transportation Needs: Not on file  Physical Activity: Not on file  Stress: Not on file  Social Connections: Not on file  Intimate Partner Violence: Not on file    Family History  Problem Relation Age of Onset   Breast cancer Neg Hx     No Known Allergies  Outpatient Medications Prior to Visit  Medication Sig   alendronate (FOSAMAX) 70 MG tablet TAKE 1 TABLET BY  MOUTH WEEKLY  WITH 8 OZ OF PLAIN WATER 30  MINUTES BEFORE FIRST FOOD, DRINK OR MEDS. STAY UPRIGHT FOR 30  MINS   Calcium Carb-Cholecalciferol (CALCIUM HIGH POTENCY/VITAMIN D) 600-5 MG-MCG TABS Take 1 tablet by mouth 2 (two) times daily.   losartan (COZAAR) 100 MG tablet TAKE 1 TABLET BY MOUTH DAILY   meloxicam (MOBIC) 15 MG tablet Take 1 tablet by mouth once daily   methimazole (TAPAZOLE) 5 MG tablet Take 5 mg by mouth.   oxyCODONE -acetaminophen  (PERCOCET) 5-325 MG tablet Take 1 tablet by mouth every 4 (four) hours as needed for severe pain (pain score 7-10).   rosuvastatin (CRESTOR) 40 MG tablet TAKE 1 TABLET BY MOUTH ONCE  DAILY   spironolactone (ALDACTONE) 25 MG tablet TAKE 1 TABLET BY MOUTH ONCE  DAILY   allopurinol (ZYLOPRIM) 100 MG tablet TAKE 1 TABLET BY MOUTH ONCE  DAILY (Patient not taking: Reported on 10/28/2023)    No facility-administered medications prior to visit.    Review of Systems  Constitutional:  Positive for malaise/fatigue and weight loss. Negative for chills, diaphoresis and fever.  HENT: Negative.  Negative for sinus pain and sore throat.   Eyes: Negative.   Respiratory: Negative.  Negative for cough and shortness of breath.   Cardiovascular: Negative.  Negative for chest pain, palpitations and leg swelling.  Gastrointestinal: Negative.  Negative for abdominal pain, constipation, diarrhea, heartburn, nausea and vomiting.  Genitourinary: Negative.  Negative for dysuria and flank pain.  Musculoskeletal: Negative.  Negative for joint pain and myalgias.  Skin: Negative.   Neurological:  Positive for dizziness. Negative for tingling, speech change, focal weakness and headaches.  Endo/Heme/Allergies: Negative.   Psychiatric/Behavioral: Negative.  Negative for depression and suicidal ideas. The patient is not nervous/anxious.        Objective:   BP 138/66   Pulse 75   Ht 5' 3 (1.6 m)   Wt 160 lb 9.6 oz (72.8 kg)   SpO2 99%   BMI 28.45 kg/m   Vitals:   10/28/23 1109  BP: 138/66  Pulse: 75  Height: 5' 3 (1.6 m)  Weight: 160 lb 9.6 oz (72.8 kg)  SpO2: 99%  BMI (Calculated): 28.46    Physical Exam Vitals and nursing note reviewed.  Constitutional:      Appearance: Normal appearance.  HENT:     Head: Normocephalic and atraumatic.     Nose: Nose normal.     Mouth/Throat:     Mouth: Mucous membranes are moist.     Pharynx: Oropharynx is clear.  Eyes:     Conjunctiva/sclera: Conjunctivae normal.     Pupils: Pupils are equal, round, and reactive to light.  Cardiovascular:     Rate and Rhythm: Normal rate and regular rhythm.     Pulses: Normal pulses.     Heart sounds: Normal heart sounds. No murmur heard. Pulmonary:     Effort: Pulmonary effort is normal.     Breath sounds: Normal breath sounds. No wheezing.  Abdominal:     General: Bowel sounds are normal.      Palpations: Abdomen is soft.     Tenderness: There is no abdominal tenderness. There is no right CVA tenderness or left CVA tenderness.  Musculoskeletal:        General: Normal range of motion.     Cervical back: Normal range of motion.     Right lower leg: No edema.     Left lower leg: No edema.  Skin:    General: Skin is  warm and dry.  Neurological:     General: No focal deficit present.     Mental Status: She is alert and oriented to person, place, and time.  Psychiatric:        Mood and Affect: Mood normal.        Behavior: Behavior normal.      No results found for any visits on 10/28/23.  Recent Results (from the past 2160 hours)  Glucose, capillary     Status: Abnormal   Collection Time: 10/20/23  8:08 PM  Result Value Ref Range   Glucose-Capillary 108 (H) 70 - 99 mg/dL    Comment: Glucose reference range applies only to samples taken after fasting for at least 8 hours.  CBC     Status: Abnormal   Collection Time: 10/20/23  9:39 PM  Result Value Ref Range   WBC 6.0 4.0 - 10.5 K/uL   RBC 4.05 3.87 - 5.11 MIL/uL   Hemoglobin 11.8 (L) 12.0 - 15.0 g/dL   HCT 64.4 (L) 63.9 - 53.9 %   MCV 87.7 80.0 - 100.0 fL   MCH 29.1 26.0 - 34.0 pg   MCHC 33.2 30.0 - 36.0 g/dL   RDW 86.5 88.4 - 84.4 %   Platelets 181 150 - 400 K/uL   nRBC 0.0 0.0 - 0.2 %    Comment: Performed at [facility], [address] [place], [place] [address]  Comprehensive metabolic panel     Status: Abnormal   Collection Time: 10/20/23  9:39 PM  Result Value Ref Range   Sodium 141 135 - 145 mmol/L   Potassium 3.6 3.5 - 5.1 mmol/L   Chloride 108 98 - 111 mmol/L   CO2 24 22 - 32 mmol/L   Glucose, Bld 119 (H) 70 - 99 mg/dL    Comment: Glucose reference range applies only to samples taken after fasting for at least 8 hours.   BUN 15 8 - 23 mg/dL   Creatinine, Ser 9.12 0.44 - 1.00 mg/dL   Calcium 8.4 (L) 8.9 - 10.3 mg/dL   Total Protein 5.9 (L) 6.5 - 8.1 g/dL   Albumin 3.5 3.5 - 5.0 g/dL    AST 24 15 - 41 U/L   ALT 28 0 - 44 U/L   Alkaline Phosphatase 35 (L) 38 - 126 U/L   Total Bilirubin 0.9 0.0 - 1.2 mg/dL   GFR, Estimated >39 >39 mL/min    Comment: (NOTE) Calculated using the CKD-EPI Creatinine Equation (2021)    Anion gap 9 5 - 15    Comment: Performed at [facility], [address], [place], [place] [address]  Troponin I (High Sensitivity)     Status: None   Collection Time: 10/20/23  9:39 PM  Result Value Ref Range   Troponin I (High Sensitivity) <2 <18 ng/L    Comment: (NOTE) Elevated high sensitivity troponin I (hsTnI) values and significant  changes across serial measurements may suggest ACS but many other  chronic and acute conditions are known to elevate hsTnI results.  Refer to the Links section for chest pain algorithms and additional  guidance. Performed at [facility], [address], [place], [place] [address]       Assessment & Plan:  Will get a stat CT of the head.  Neurology consult and EEG.   It is possible that she had a Vasovagal / Convulsive syncope due to the pain.   Orthopedic evaluation as scheduled Needs close monitoring.   Problem List  Items Addressed This Visit     Essential hypertension, benign   Mixed hyperlipidemia   Hyperthyroidism   Other Visit Diagnoses       Seizure-like activity (HCC)    -  Primary   Relevant Orders   CT HEAD W & WO CONTRAST ( ) (Completed)   Ambulatory referral to Neurology     Vasovagal syncope       Relevant Orders   Ambulatory referral to Neurology     Prediabetes           Follow up in one week.  Total time spent: 30 minutes  FERNAND FREDY RAMAN, MD  10/28/2023   This document may have been prepared by [facility] Voice Recognition software and as such may include unintentional dictation errors.

## 2023-10-28 NOTE — Progress Notes (Signed)
 Patient notified

## 2023-10-31 DIAGNOSIS — S63502D Unspecified sprain of left wrist, subsequent encounter: Secondary | ICD-10-CM | POA: Diagnosis not present

## 2023-10-31 DIAGNOSIS — R55 Syncope and collapse: Secondary | ICD-10-CM | POA: Diagnosis not present

## 2023-11-04 ENCOUNTER — Ambulatory Visit (INDEPENDENT_AMBULATORY_CARE_PROVIDER_SITE_OTHER): Admitting: Internal Medicine

## 2023-11-04 ENCOUNTER — Encounter: Payer: Self-pay | Admitting: Internal Medicine

## 2023-11-04 VITALS — BP 116/72 | HR 67 | Ht 63.0 in | Wt 160.0 lb

## 2023-11-04 DIAGNOSIS — E0590 Thyrotoxicosis, unspecified without thyrotoxic crisis or storm: Secondary | ICD-10-CM | POA: Diagnosis not present

## 2023-11-04 DIAGNOSIS — I10 Essential (primary) hypertension: Secondary | ICD-10-CM

## 2023-11-04 DIAGNOSIS — Z1231 Encounter for screening mammogram for malignant neoplasm of breast: Secondary | ICD-10-CM

## 2023-11-04 DIAGNOSIS — E0520 Thyrotoxicosis with toxic multinodular goiter without thyrotoxic crisis or storm: Secondary | ICD-10-CM | POA: Diagnosis not present

## 2023-11-04 DIAGNOSIS — R7303 Prediabetes: Secondary | ICD-10-CM

## 2023-11-04 DIAGNOSIS — M109 Gout, unspecified: Secondary | ICD-10-CM

## 2023-11-04 DIAGNOSIS — E782 Mixed hyperlipidemia: Secondary | ICD-10-CM

## 2023-11-04 NOTE — Progress Notes (Signed)
 Established Patient Office Visit  Subjective:  Patient ID: Laurie Spears, female    DOB: Feb 02, 1946  Age: 78 y.o. MRN: [id number]  Chief Complaint  Patient presents with   Follow-up    1 week follow up    Patient comes in for follow-up today.  She is feeling much better and and clear in her head.  The CT of the head was unremarkable.  She did not have any further episodes of syncope or involuntary movements.  However she does have an appointment with a neurologist. Denies headache or dizziness, no nausea vomiting, no chest pain or palpitations.    No other concerns at this time.   Past Medical History:  Diagnosis Date   Gouty arthritis    High cholesterol    Hypertension    Hyperthyroidism    Prediabetes     Past Surgical History:  Procedure Laterality Date   ABDOMINAL HYSTERECTOMY  1980   BREAST EXCISIONAL BIOPSY Bilateral yrs ago   benign    Social History   Socioeconomic History   Marital status: Divorced    Spouse name: Not on file   Number of children: Not on file   Years of education: Not on file   Highest education level: Not on file  Occupational History   Not on file  Tobacco Use   Smoking status: Former    Current packs/day: 0.00    Types: Cigarettes    Quit date: 06/26/1994    Years since quitting: 29.3   Smokeless tobacco: Not on file  Vaping Use   Vaping status: Never Used  Substance and Sexual Activity   Alcohol use: No    Alcohol/week: 0.0 standard drinks of alcohol   Drug use: Never   Sexual activity: Not on file  Other Topics Concern   Not on file  Social History Narrative   Not on file   Social Drivers of Health   Financial Resource Strain: Not on file  Food Insecurity: Not on file  Transportation Needs: Not on file  Physical Activity: Not on file  Stress: Not on file  Social Connections: Not on file  Intimate Partner Violence: Not on file    Family History  Problem Relation Age of Onset   Breast cancer Neg Hx     No  Known Allergies  Outpatient Medications Prior to Visit  Medication Sig   alendronate (FOSAMAX) 70 MG tablet TAKE 1 TABLET BY MOUTH WEEKLY  WITH 8 OZ OF PLAIN WATER 30  MINUTES BEFORE FIRST FOOD, DRINK OR MEDS. STAY UPRIGHT FOR 30  MINS   Calcium Carb-Cholecalciferol (CALCIUM HIGH POTENCY/VITAMIN D) 600-5 MG-MCG TABS Take 1 tablet by mouth 2 (two) times daily.   losartan (COZAAR) 100 MG tablet TAKE 1 TABLET BY MOUTH DAILY   meloxicam (MOBIC) 15 MG tablet Take 1 tablet by mouth once daily   methimazole (TAPAZOLE) 5 MG tablet Take 2.5 mg by mouth daily.   oxyCODONE -acetaminophen  (PERCOCET) 5-325 MG tablet Take 1 tablet by mouth every 4 (four) hours as needed for severe pain (pain score 7-10).   rosuvastatin (CRESTOR) 40 MG tablet TAKE 1 TABLET BY MOUTH ONCE  DAILY   spironolactone (ALDACTONE) 25 MG tablet TAKE 1 TABLET BY MOUTH ONCE  DAILY   allopurinol (ZYLOPRIM) 100 MG tablet TAKE 1 TABLET BY MOUTH ONCE  DAILY (Patient not taking: Reported on 11/04/2023)   methimazole (TAPAZOLE) 5 MG tablet Take 5 mg by mouth. (Patient not taking: Reported on 11/04/2023)   No facility-administered medications  prior to visit.    Review of Systems  Constitutional: Negative.  Negative for chills, diaphoresis, fever, malaise/fatigue and weight loss.  HENT: Negative.  Negative for sore throat.   Eyes: Negative.   Respiratory: Negative.  Negative for cough and shortness of breath.   Cardiovascular: Negative.  Negative for chest pain, palpitations and leg swelling.  Gastrointestinal: Negative.  Negative for abdominal pain, constipation, diarrhea, heartburn, nausea and vomiting.  Genitourinary: Negative.  Negative for dysuria and flank pain.  Musculoskeletal: Negative.  Negative for joint pain and myalgias.  Skin: Negative.   Neurological: Negative.  Negative for dizziness, tingling, tremors and headaches.  Endo/Heme/Allergies: Negative.   Psychiatric/Behavioral: Negative.  Negative for depression and suicidal  ideas. The patient is not nervous/anxious.        Objective:   BP 116/72   Pulse 67   Ht 5' 3 (1.6 m)   Wt 160 lb (72.6 kg)   SpO2 99%   BMI 28.34 kg/m   Vitals:   11/04/23 1051  BP: 116/72  Pulse: 67  Height: 5' 3 (1.6 m)  Weight: 160 lb (72.6 kg)  SpO2: 99%  BMI (Calculated): 28.35    Physical Exam Vitals and nursing note reviewed.  Constitutional:      Appearance: Normal appearance.  HENT:     Head: Normocephalic and atraumatic.     Nose: Nose normal.     Mouth/Throat:     Mouth: Mucous membranes are moist.     Pharynx: Oropharynx is clear.  Eyes:     Conjunctiva/sclera: Conjunctivae normal.     Pupils: Pupils are equal, round, and reactive to light.  Cardiovascular:     Rate and Rhythm: Normal rate and regular rhythm.     Pulses: Normal pulses.     Heart sounds: Normal heart sounds. No murmur heard. Pulmonary:     Effort: Pulmonary effort is normal.     Breath sounds: Normal breath sounds. No wheezing.  Abdominal:     General: Bowel sounds are normal.     Palpations: Abdomen is soft.     Tenderness: There is no abdominal tenderness. There is no right CVA tenderness or left CVA tenderness.  Musculoskeletal:        General: Normal range of motion.     Cervical back: Normal range of motion.     Right lower leg: No edema.     Left lower leg: No edema.  Skin:    General: Skin is warm and dry.  Neurological:     General: No focal deficit present.     Mental Status: She is alert and oriented to person, place, and time.  Psychiatric:        Mood and Affect: Mood normal.        Behavior: Behavior normal.      No results found for any visits on 11/04/23.  Recent Results (from the past 2160 hours)  Glucose, capillary     Status: Abnormal   Collection Time: 10/20/23  8:08 PM  Result Value Ref Range   Glucose-Capillary 108 (H) 70 - 99 mg/dL    Comment: Glucose reference range applies only to samples taken after fasting for at least 8 hours.  CBC      Status: Abnormal   Collection Time: 10/20/23  9:39 PM  Result Value Ref Range   WBC 6.0 4.0 - 10.5 K/uL   RBC 4.05 3.87 - 5.11 MIL/uL   Hemoglobin 11.8 (L) 12.0 - 15.0 g/dL   HCT 64.4 (L)  36.0 - 46.0 %   MCV 87.7 80.0 - 100.0 fL   MCH 29.1 26.0 - 34.0 pg   MCHC 33.2 30.0 - 36.0 g/dL   RDW 86.5 88.4 - 84.4 %   Platelets 181 150 - 400 K/uL   nRBC 0.0 0.0 - 0.2 %    Comment: Performed at [facility], [address] [place], [place] [address]  Comprehensive metabolic panel     Status: Abnormal   Collection Time: 10/20/23  9:39 PM  Result Value Ref Range   Sodium 141 135 - 145 mmol/L   Potassium 3.6 3.5 - 5.1 mmol/L   Chloride 108 98 - 111 mmol/L   CO2 24 22 - 32 mmol/L   Glucose, Bld 119 (H) 70 - 99 mg/dL    Comment: Glucose reference range applies only to samples taken after fasting for at least 8 hours.   BUN 15 8 - 23 mg/dL   Creatinine, Ser 9.12 0.44 - 1.00 mg/dL   Calcium 8.4 (L) 8.9 - 10.3 mg/dL   Total Protein 5.9 (L) 6.5 - 8.1 g/dL   Albumin 3.5 3.5 - 5.0 g/dL   AST 24 15 - 41 U/L   ALT 28 0 - 44 U/L   Alkaline Phosphatase 35 (L) 38 - 126 U/L   Total Bilirubin 0.9 0.0 - 1.2 mg/dL   GFR, Estimated >39 >39 mL/min    Comment: (NOTE) Calculated using the CKD-EPI Creatinine Equation (2021)    Anion gap 9 5 - 15    Comment: Performed at [facility], [address] [place], [place] [address]  Troponin I (High Sensitivity)     Status: None   Collection Time: 10/20/23  9:39 PM  Result Value Ref Range   Troponin I (High Sensitivity) <2 <18 ng/L    Comment: (NOTE) Elevated high sensitivity troponin I (hsTnI) values and significant  changes across serial measurements may suggest ACS but many other  chronic and acute conditions are known to elevate hsTnI results.  Refer to the Links section for chest pain algorithms and additional  guidance. Performed at [facility], [address], [place], [place] [address]       Assessment & Plan:   Continue current medications.  Keep her appointment with the neurologist.  Schedule mammogram. Problem List Items Addressed This Visit     Essential hypertension, benign - Primary   Gouty arthritis   Relevant Orders   Uric acid   Mixed hyperlipidemia   Relevant Orders   Lipid Panel w/o Chol/HDL Ratio   Hyperthyroidism   Relevant Medications   methimazole (TAPAZOLE) 5 MG tablet   Other Visit Diagnoses       Breast cancer screening by mammogram       Relevant Orders   MM 3D SCREENING MAMMOGRAM BILATERAL BREAST     Prediabetes       Relevant Orders   Hemoglobin A1c       Return in about 3 months (around 02/04/2024).   Total time spent: 30 minutes  FERNAND FREDY RAMAN, MD  11/04/2023   This document may have been prepared by [facility] Voice Recognition software and as such may include unintentional dictation errors.

## 2023-11-05 LAB — URIC ACID: Uric Acid: 6.5 mg/dL (ref 3.1–7.9)

## 2023-11-05 LAB — LIPID PANEL W/O CHOL/HDL RATIO
Cholesterol, Total: 131 mg/dL (ref 100–199)
HDL: 48 mg/dL (ref >39)
LDL Chol Calc (NIH): 65 mg/dL (ref 0–99)
Triglycerides: 93 mg/dL (ref 0–149)
VLDL Cholesterol Cal: 18 mg/dL (ref 5–40)

## 2023-11-05 LAB — HEMOGLOBIN A1C
Est. average glucose Bld gHb Est-mCnc: 128 mg/dL
Hgb A1c MFr Bld: 6.1 % — ABNORMAL HIGH (ref 4.8–5.6)

## 2023-11-06 ENCOUNTER — Ambulatory Visit: Payer: Self-pay | Admitting: Internal Medicine

## 2023-11-06 NOTE — Progress Notes (Signed)
 Patient notified

## 2023-11-10 ENCOUNTER — Ambulatory Visit: Admitting: Internal Medicine

## 2024-01-01 DIAGNOSIS — M170 Bilateral primary osteoarthritis of knee: Secondary | ICD-10-CM | POA: Diagnosis not present

## 2024-01-05 ENCOUNTER — Ambulatory Visit
Admission: RE | Admit: 2024-01-05 | Discharge: 2024-01-05 | Disposition: A | Discharge status: Home / Self Care | Source: Ambulatory Visit | Attending: Internal Medicine | Admitting: Internal Medicine

## 2024-01-05 DIAGNOSIS — Z1231 Encounter for screening mammogram for malignant neoplasm of breast: Secondary | ICD-10-CM | POA: Diagnosis not present

## 2024-01-12 DIAGNOSIS — R55 Syncope and collapse: Secondary | ICD-10-CM | POA: Diagnosis not present

## 2024-01-12 DIAGNOSIS — I739 Peripheral vascular disease, unspecified: Secondary | ICD-10-CM | POA: Diagnosis not present

## 2024-01-12 DIAGNOSIS — E538 Deficiency of other specified B group vitamins: Secondary | ICD-10-CM | POA: Diagnosis not present

## 2024-01-14 ENCOUNTER — Other Ambulatory Visit: Payer: Self-pay | Admitting: Neurology

## 2024-01-14 DIAGNOSIS — R55 Syncope and collapse: Secondary | ICD-10-CM

## 2024-01-20 ENCOUNTER — Ambulatory Visit
Admission: RE | Admit: 2024-01-20 | Discharge: 2024-01-20 | Disposition: A | Discharge status: Home / Self Care | Source: Ambulatory Visit | Attending: Neurology | Admitting: Neurology

## 2024-01-20 DIAGNOSIS — R55 Syncope and collapse: Secondary | ICD-10-CM | POA: Insufficient documentation

## 2024-02-02 ENCOUNTER — Ambulatory Visit: Admitting: Anesthesiology

## 2024-02-02 ENCOUNTER — Encounter
Admission: RE | Disposition: A | Discharge status: Home / Self Care | Payer: Self-pay | Source: Home / Self Care | Attending: Gastroenterology

## 2024-02-02 ENCOUNTER — Other Ambulatory Visit: Payer: Self-pay

## 2024-02-02 ENCOUNTER — Encounter: Payer: Self-pay | Admitting: Gastroenterology

## 2024-02-02 ENCOUNTER — Ambulatory Visit
Admission: RE | Admit: 2024-02-02 | Discharge: 2024-02-02 | Disposition: A | Discharge status: Home / Self Care | Attending: Gastroenterology | Admitting: Gastroenterology

## 2024-02-02 DIAGNOSIS — E0590 Thyrotoxicosis, unspecified without thyrotoxic crisis or storm: Secondary | ICD-10-CM | POA: Diagnosis not present

## 2024-02-02 DIAGNOSIS — Q438 Other specified congenital malformations of intestine: Secondary | ICD-10-CM | POA: Insufficient documentation

## 2024-02-02 DIAGNOSIS — Z87891 Personal history of nicotine dependence: Secondary | ICD-10-CM | POA: Diagnosis not present

## 2024-02-02 DIAGNOSIS — D124 Benign neoplasm of descending colon: Secondary | ICD-10-CM | POA: Diagnosis not present

## 2024-02-02 DIAGNOSIS — K635 Polyp of colon: Secondary | ICD-10-CM | POA: Diagnosis not present

## 2024-02-02 DIAGNOSIS — Z83719 Family history of colon polyps, unspecified: Secondary | ICD-10-CM | POA: Diagnosis not present

## 2024-02-02 DIAGNOSIS — Z1211 Encounter for screening for malignant neoplasm of colon: Secondary | ICD-10-CM | POA: Insufficient documentation

## 2024-02-02 DIAGNOSIS — D122 Benign neoplasm of ascending colon: Secondary | ICD-10-CM | POA: Insufficient documentation

## 2024-02-02 DIAGNOSIS — I10 Essential (primary) hypertension: Secondary | ICD-10-CM | POA: Diagnosis not present

## 2024-02-02 HISTORY — PX: POLYPECTOMY: SHX149

## 2024-02-02 HISTORY — PX: COLONOSCOPY: SHX5424

## 2024-02-02 SURGERY — COLONOSCOPY
Anesthesia: General

## 2024-02-02 MED ORDER — PROPOFOL 500 MG/50ML IV EMUL
INTRAVENOUS | Status: DC | PRN
  Administered 2024-02-02: 75 ug/kg/min via INTRAVENOUS

## 2024-02-02 MED ORDER — PHENYLEPHRINE 80 MCG/ML (10ML) SYRINGE FOR IV PUSH (FOR BLOOD PRESSURE SUPPORT)
PREFILLED_SYRINGE | INTRAVENOUS | Status: DC | PRN
  Administered 2024-02-02: 160 ug via INTRAVENOUS

## 2024-02-02 MED ORDER — LIDOCAINE HCL (CARDIAC) PF 100 MG/5ML IV SOSY
PREFILLED_SYRINGE | INTRAVENOUS | Status: DC | PRN
  Administered 2024-02-02: 60 mg via INTRAVENOUS

## 2024-02-02 MED ORDER — PROPOFOL 10 MG/ML IV BOLUS
INTRAVENOUS | Status: DC | PRN
  Administered 2024-02-02: 40 mg via INTRAVENOUS
  Administered 2024-02-02: 20 mg via INTRAVENOUS
  Administered 2024-02-02: 40 mg via INTRAVENOUS

## 2024-02-02 MED ORDER — SODIUM CHLORIDE 0.9 % IV SOLN: INTRAVENOUS | Status: DC

## 2024-02-02 MED ORDER — DEXMEDETOMIDINE HCL IN NACL 80 MCG/20ML IV SOLN
INTRAVENOUS | Status: DC | PRN
  Administered 2024-02-02: 8 ug via INTRAVENOUS
  Administered 2024-02-02: 12 ug via INTRAVENOUS

## 2024-02-02 NOTE — Op Note (Signed)
 [facility] Patient Name: Laurie Spears Procedure Date: 02/02/2024 8:26 AM MRN: [id number] Account #: [id number] Date of Birth: 07/05/1945 Admit Type: Outpatient Age: 78 Room: [facility] ENDO ROOM 1 Gender: Female Note Status: Finalized Instrument Name: Colon Scope [id number] Procedure:             Colonoscopy Indications:           Screening for colorectal malignant neoplasm, Colon                         cancer screening in patient at increased risk: Family                         history of 1st-degree relative with colon polyps, Last                         colonoscopy: January 2009 Providers:             Ruel Kung MD, MD Referring MD:          Ruel Kung MD, MD (Referring MD), Fredy CANDIE Bathe, MD                         (Referring MD) Medicines:             Monitored Anesthesia Care Complications:         No immediate complications. Procedure:             Pre-Anesthesia Assessment:                        - Prior to the procedure, a History and Physical was                         performed, and patient medications, allergies and                         sensitivities were reviewed. The patient's tolerance                         of previous anesthesia was reviewed.                        - The risks and benefits of the procedure and the                         sedation options and risks were discussed with the                         patient. All questions were answered and informed                         consent was obtained.                        - ASA Grade Assessment: II - A patient with mild                         systemic disease.                        After  obtaining informed consent, the colonoscope was                         passed under direct vision. Throughout the procedure,                         the patient's blood pressure, pulse, and oxygen                         saturations were monitored continuously. The                          Colonoscope was introduced through the anus and                         advanced to the the cecum, identified by the                         appendiceal orifice. The colonoscopy was technically                         difficult and complex due to significant looping.                         Successful completion of the procedure was aided by                         withdrawing the scope and replacing with the pediatric                         colonoscope. The patient tolerated the procedure well.                         The quality of the bowel preparation was excellent.                         The ileocecal valve, appendiceal orifice, and rectum                         were photographed. Findings:      The perianal and digital rectal examinations were normal.      Seven sessile polyps were found in the ascending colon. The polyps were       5 to 6 mm in size. These polyps were removed with a cold snare.       Resection and retrieval were complete.      A 5 mm polyp was found in the descending colon. The polyp was sessile.       The polyp was removed with a cold snare. Resection and retrieval were       complete.      The exam was otherwise without abnormality on direct and retroflexion       views. Impression:            - Seven 5 to 6 mm polyps in the ascending colon,                         removed with a cold snare. Resected and retrieved.                        -  One 5 mm polyp in the descending colon, removed with                         a cold snare. Resected and retrieved.                        - The examination was otherwise normal on direct and                         retroflexion views. Recommendation:        - Discharge patient to home (with escort).                        - Resume previous diet.                        - Continue present medications.                        - Await pathology results.                        - Repeat colonoscopy is not recommended due to  current                         age (74 years or older) for surveillance. Procedure Code(s):     --- Professional ---                        [id number], Colonoscopy, flexible; with removal of                         tumor(s), polyp(s), or other lesion(s) by snare                         technique Diagnosis Code(s):     --- Professional ---                        Z12.11, Encounter for screening for malignant neoplasm                         of colon                        Z83.71, Family history of colonic polyps                        D12.2, Benign neoplasm of ascending colon                        D12.4, Benign neoplasm of descending colon CPT copyright 2022 American Medical Association. All rights reserved. The codes documented in this report are preliminary and upon coder review may  be revised to meet current compliance requirements. Ruel Kung, MD Ruel Kung MD, MD 02/02/2024 9:11:30 AM This report has been signed electronically. Number of Addenda: 0 Note Initiated On: 02/02/2024 8:26 AM Scope Withdrawal Time: 0 hours 11 minutes 31 seconds  Total Procedure Duration: 0 hours 36 minutes 11 seconds  Estimated Blood Loss:  Estimated blood loss: none.      [facility]

## 2024-02-02 NOTE — Anesthesia Preprocedure Evaluation (Signed)
 Anesthesia Evaluation  Patient identified by MRN, date of birth, ID band Patient awake    Reviewed: Allergy & Precautions, NPO status , Patient's Chart, lab work & pertinent test results  History of Anesthesia Complications Negative for: history of anesthetic complications  Airway Mallampati: III  TM Distance: >3 FB Neck ROM: full    Dental no notable dental hx.    Pulmonary neg pulmonary ROS, former smoker   Pulmonary exam normal        Cardiovascular hypertension, On Medications negative cardio ROS Normal cardiovascular exam     Neuro/Psych negative neurological ROS  negative psych ROS   GI/Hepatic negative GI ROS, Neg liver ROS,,,  Endo/Other   Hyperthyroidism   Renal/GU negative Renal ROS  negative genitourinary   Musculoskeletal   Abdominal   Peds  Hematology negative hematology ROS (+)   Anesthesia Other Findings Past Medical History: No date: Gouty arthritis No date: High cholesterol No date: Hypertension No date: Hyperthyroidism No date: Prediabetes  Past Surgical History: 1980: ABDOMINAL HYSTERECTOMY yrs ago: BREAST EXCISIONAL BIOPSY; Bilateral     Comment:  benign     Reproductive/Obstetrics negative OB ROS                              Anesthesia Physical Anesthesia Plan  ASA: 3  Anesthesia Plan: General   Post-op Pain Management: Minimal or no pain anticipated   Induction: Intravenous  PONV Risk Score and Plan: Propofol infusion and TIVA  Airway Management Planned: Natural Airway and Nasal Cannula  Additional Equipment:   Intra-op Plan:   Post-operative Plan:   Informed Consent: I have reviewed the patients History and Physical, chart, labs and discussed the procedure including the risks, benefits and alternatives for the proposed anesthesia with the patient or authorized representative who has indicated his/her understanding and acceptance.      Dental Advisory Given  Plan Discussed with: Anesthesiologist, CRNA and Surgeon  Anesthesia Plan Comments: (Patient consented for risks of anesthesia including but not limited to:  - adverse reactions to medications - risk of airway placement if required - damage to eyes, teeth, lips or other oral mucosa - nerve damage due to positioning  - sore throat or hoarseness - Damage to heart, brain, nerves, lungs, other parts of body or loss of life  Patient voiced understanding and assent.)        Anesthesia Quick Evaluation

## 2024-02-02 NOTE — Anesthesia Postprocedure Evaluation (Signed)
 Anesthesia Post Note  Patient: EDWINA GROSSBERG  Procedure(s) Performed: COLONOSCOPY POLYPECTOMY, INTESTINE  Patient location during evaluation: Endoscopy Anesthesia Type: General Level of consciousness: awake and alert Pain management: pain level controlled Vital Signs Assessment: post-procedure vital signs reviewed and stable Respiratory status: spontaneous breathing, nonlabored ventilation, respiratory function stable and patient connected to nasal cannula oxygen Cardiovascular status: blood pressure returned to baseline and stable Postop Assessment: no apparent nausea or vomiting Anesthetic complications: no   No notable events documented.   Last Vitals:  Vitals:   02/02/24 0922 02/02/24 0932  BP: (!) 93/57 104/74  Pulse: 71 61  Resp: 16 15  Temp:    SpO2: 100% 99%    Last Pain:  Vitals:   02/02/24 0932  TempSrc:   PainSc: 0-No pain                 Lendia LITTIE Mae

## 2024-02-02 NOTE — Transfer of Care (Signed)
 Immediate Anesthesia Transfer of Care Note  Patient: Laurie Spears  Procedure(s) Performed: COLONOSCOPY POLYPECTOMY, INTESTINE  Patient Location: PACU  Anesthesia Type:General  Level of Consciousness: sedated  Airway & Oxygen Therapy: Patient Spontanous Breathing  Post-op Assessment: Report given to RN and Post -op Vital signs reviewed and stable  Post vital signs: Reviewed and stable  Last Vitals:  Vitals Value Taken Time  BP 89/66 02/02/24 09:12  Temp    Pulse 73 02/02/24 09:12  Resp 19 02/02/24 09:12  SpO2 97 % 02/02/24 09:12  Vitals shown include unfiled device data.  Last Pain:  Vitals:   02/02/24 0759  TempSrc: Temporal  PainSc: 0-No pain         Complications: No notable events documented.

## 2024-02-02 NOTE — H&P (Signed)
 Ruel Kung , MD [address], [address], [place], [place], [address] Phone: [phone] Fax: [phone]  Primary Care Physician:  Fernand Fredy RAMAN, MD   Pre-Procedure History & Physical: HPI:  Laurie Spears is a 78 y.o. female is here for an colonoscopy.   Past Medical History:  Diagnosis Date   Gouty arthritis    High cholesterol    Hypertension    Hyperthyroidism    Prediabetes     Past Surgical History:  Procedure Laterality Date   ABDOMINAL HYSTERECTOMY  1980   BREAST EXCISIONAL BIOPSY Bilateral yrs ago   benign   COLONOSCOPY     x4    Prior to Admission medications   Medication Sig Start Date End Date Taking? Authorizing Provider  alendronate (FOSAMAX) 70 MG tablet TAKE 1 TABLET BY MOUTH WEEKLY  WITH 8 OZ OF PLAIN WATER 30  MINUTES BEFORE FIRST FOOD, DRINK OR MEDS. STAY UPRIGHT FOR 30  MINS 08/07/23  Yes Orlean Alan HERO, FNP  Calcium Carb-Cholecalciferol (CALCIUM HIGH POTENCY/VITAMIN D) 600-5 MG-MCG TABS Take 1 tablet by mouth 2 (two) times daily.   Yes [provider]  losartan (COZAAR) 100 MG tablet TAKE 1 TABLET BY MOUTH DAILY 08/14/23  Yes Orlean Alan HERO, FNP  methimazole (TAPAZOLE) 5 MG tablet Take 2.5 mg by mouth daily. 11/04/23  Yes [provider]  rosuvastatin (CRESTOR) 40 MG tablet TAKE 1 TABLET BY MOUTH ONCE  DAILY 09/05/23  Yes Fernand Fredy RAMAN, MD  spironolactone (ALDACTONE) 25 MG tablet TAKE 1 TABLET BY MOUTH ONCE  DAILY 04/25/23  Yes Fernand Fredy RAMAN, MD  allopurinol (ZYLOPRIM) 100 MG tablet TAKE 1 TABLET BY MOUTH ONCE  DAILY Patient not taking: Reported on 11/04/2023 01/20/23   Fernand Fredy RAMAN, MD  meloxicam [facility]) 15 MG tablet Take 1 tablet by mouth once daily 10/23/22   Khan, Neelam S, MD  methimazole (TAPAZOLE) 5 MG tablet Take 5 mg by mouth. Patient not taking: Reported on 11/04/2023 01/12/15   [provider]  oxyCODONE -acetaminophen  (PERCOCET) 5-325 MG tablet Take 1 tablet by mouth every 4 (four) hours as needed for severe  pain (pain score 7-10). 10/20/23   Dorothyann Drivers, MD    Allergies as of 01/29/2024   (No Known Allergies)    Family History  Problem Relation Age of Onset   Breast cancer Neg Hx     Social History   Socioeconomic History   Marital status: Divorced    Spouse name: Not on file   Number of children: Not on file   Years of education: Not on file   Highest education level: Not on file  Occupational History   Not on file  Tobacco Use   Smoking status: Former    Current packs/day: 0.00    Types: Cigarettes    Quit date: 06/26/1994    Years since quitting: 29.6   Smokeless tobacco: Not on file  Vaping Use   Vaping status: Never Used  Substance and Sexual Activity   Alcohol use: No    Alcohol/week: 0.0 standard drinks of alcohol   Drug use: Never   Sexual activity: Not on file  Other Topics Concern   Not on file  Social History Narrative   LIVES [place]   Social Drivers of Health   Financial Resource Strain: Low Risk  (01/16/2024)   [facility] from [facility]   Overall Financial Resource Strain (CARDIA)    Difficulty of Paying Living Expenses: Not very hard  Food Insecurity: No Food Insecurity (01/16/2024)   Received from [facility]   Hunger Vital Sign    Within the past 12 months, you worried that your food would run out before you got the money to buy more.: Never true    Within the past 12 months, the food you bought just didn't last and you didn't have money to get more.: Never true  Transportation Needs: No Transportation Needs (01/16/2024)   Received from [facility] - Transportation    In the past 12 months, has lack of transportation kept you from medical appointments or from getting medications?: No    Lack of Transportation (Non-Medical): No  Physical Activity: Not on file  Stress: Not on file  Social Connections: Not on file  Intimate Partner Violence: Not on file    Review of  Systems: See HPI, otherwise negative ROS  Physical Exam: BP (!) 130/57   Pulse 67   Temp (!) 96.7 F (35.9 C) (Temporal)   Resp 16   Wt 72.4 kg   BMI 28.27 kg/m  General:   Alert,  pleasant and cooperative in NAD Head:  Normocephalic and atraumatic. Neck:  Supple; no masses or thyromegaly. Lungs:  Clear throughout to auscultation, normal respiratory effort.    Heart:  +S1, +S2, Regular rate and rhythm, No edema. Abdomen:  Soft, nontender and nondistended. Normal bowel sounds, without guarding, and without rebound.   Neurologic:  Alert and  oriented x4;  grossly normal neurologically.  Impression/Plan: Laurie Spears is here for an colonoscopy to be performed for surveillance due to family history  of colon polyps   Risks, benefits, limitations, and alternatives regarding  colonoscopy have been reviewed with the patient.  Questions have been answered.  All parties agreeable.   Ruel Kung, MD  02/02/2024, 8:11 AM

## 2024-02-03 LAB — SURGICAL PATHOLOGY

## 2024-02-09 ENCOUNTER — Ambulatory Visit: Admitting: Internal Medicine

## 2024-02-12 ENCOUNTER — Encounter: Payer: Self-pay | Admitting: Internal Medicine

## 2024-02-12 ENCOUNTER — Ambulatory Visit (INDEPENDENT_AMBULATORY_CARE_PROVIDER_SITE_OTHER): Admitting: Internal Medicine

## 2024-02-12 VITALS — BP 122/76 | HR 70 | Ht 63.0 in | Wt 166.6 lb

## 2024-02-12 DIAGNOSIS — E0590 Thyrotoxicosis, unspecified without thyrotoxic crisis or storm: Secondary | ICD-10-CM

## 2024-02-12 DIAGNOSIS — E538 Deficiency of other specified B group vitamins: Secondary | ICD-10-CM | POA: Diagnosis not present

## 2024-02-12 DIAGNOSIS — R7303 Prediabetes: Secondary | ICD-10-CM

## 2024-02-12 DIAGNOSIS — E782 Mixed hyperlipidemia: Secondary | ICD-10-CM

## 2024-02-12 DIAGNOSIS — Z23 Encounter for immunization: Secondary | ICD-10-CM

## 2024-02-12 DIAGNOSIS — M109 Gout, unspecified: Secondary | ICD-10-CM

## 2024-02-12 DIAGNOSIS — I10 Essential (primary) hypertension: Secondary | ICD-10-CM | POA: Diagnosis not present

## 2024-02-12 MED ORDER — VITAMIN B-12 1000 MCG PO TABS: 1000.0000 ug | ORAL_TABLET | Freq: Every day | ORAL | 3 refills | Status: AC

## 2024-02-12 NOTE — Addendum Note (Signed)
 Addended by: FERNAND FREDY RAMAN on: 02/12/2024 04:20 PM   Modules accepted: Orders

## 2024-02-12 NOTE — Progress Notes (Signed)
 Established Patient Office Visit  Subjective:  Patient ID: Laurie Spears, female    DOB: [birth year]  Age: 78 y.o. MRN: [id number]  Chief Complaint  Patient presents with   Follow-up    3 month follow up    In for a follow-up today.  She recently had her colonoscopy and was found to have several polyps with tubular adenomas.  She has been advised that she does not need any more colonoscopies due to her age, however will check a Cologuard in 5 years.  She is generally feeling well and has no new complaints.  She has been evaluated by the neurology for presyncope and possible seizure.  Her MRI of the brain is negative, and she is scheduled for an EEG.  Her labs showed a low normal vitamin B12 levels and has been advised to take vitamin B12 supplement, will send in a prescription.  Patient is up-to-date on her mammogram.  Will return fasting for labs.    No other concerns at this time.   Past Medical History:  Diagnosis Date   Gouty arthritis    High cholesterol    Hypertension    Hyperthyroidism    Prediabetes     Past Surgical History:  Procedure Laterality Date   ABDOMINAL HYSTERECTOMY  1980   BREAST EXCISIONAL BIOPSY Bilateral yrs ago   benign   COLONOSCOPY     x4   COLONOSCOPY N/A 02/02/2024   Procedure: COLONOSCOPY;  Surgeon: Therisa Bi, MD;  Location: [facility] ENDOSCOPY;  Service: Gastroenterology;  Laterality: N/A;   POLYPECTOMY  02/02/2024   Procedure: POLYPECTOMY, INTESTINE;  Surgeon: Therisa Bi, MD;  Location: [facility] ENDOSCOPY;  Service: Gastroenterology;;    Social History   Socioeconomic History   Marital status: Divorced    Spouse name: Not on file   Number of children: Not on file   Years of education: Not on file   Highest education level: Not on file  Occupational History   Not on file  Tobacco Use   Smoking status: Former    Current packs/day: 0.00    Types: Cigarettes    Quit date: 06/26/1994    Years since quitting: 29.6   Smokeless tobacco: Not on  file  Vaping Use   Vaping status: Never Used  Substance and Sexual Activity   Alcohol use: No    Alcohol/week: 0.0 standard drinks of alcohol   Drug use: Never   Sexual activity: Not on file  Other Topics Concern   Not on file  Social History Narrative   [name]   Social Drivers of Health   Financial Resource Strain: Low Risk  (01/16/2024)   Received from [facility]   Overall Financial Resource Strain (CARDIA)    Difficulty of Paying Living Expenses: Not very hard  Food Insecurity: No Food Insecurity (01/16/2024)   Received from [facility]   Hunger Vital Sign    Within the past 12 months, you worried that your food would run out before you got the money to buy more.: Never true    Within the past 12 months, the food you bought just didn't last and you didn't have money to get more.: Never true  Transportation Needs: No Transportation Needs (01/16/2024)   Received from [facility] - Transportation    In the past 12 months, has lack of transportation kept you from medical appointments or from getting medications?: No    Lack of Transportation (Non-Medical):  No  Physical Activity: Not on file  Stress: Not on file  Social Connections: Not on file  Intimate Partner Violence: Not on file    Family History  Problem Relation Age of Onset   Breast cancer Neg Hx     No Known Allergies  Outpatient Medications Prior to Visit  Medication Sig   alendronate (FOSAMAX) 70 MG tablet TAKE 1 TABLET BY MOUTH WEEKLY  WITH 8 OZ OF PLAIN WATER 30  MINUTES BEFORE FIRST FOOD, DRINK OR MEDS. STAY UPRIGHT FOR 30  MINS   Calcium Carb-Cholecalciferol (CALCIUM HIGH POTENCY/VITAMIN D) 600-5 MG-MCG TABS Take 1 tablet by mouth 2 (two) times daily.   losartan (COZAAR) 100 MG tablet TAKE 1 TABLET BY MOUTH DAILY   meloxicam (MOBIC) 15 MG tablet Take 1 tablet by mouth once daily   methimazole (TAPAZOLE) 5 MG tablet Take 2.5 mg  by mouth daily.   oxyCODONE -acetaminophen  (PERCOCET) 5-325 MG tablet Take 1 tablet by mouth every 4 (four) hours as needed for severe pain (pain score 7-10).   rosuvastatin (CRESTOR) 40 MG tablet TAKE 1 TABLET BY MOUTH ONCE  DAILY   spironolactone (ALDACTONE) 25 MG tablet TAKE 1 TABLET BY MOUTH ONCE  DAILY   allopurinol (ZYLOPRIM) 100 MG tablet TAKE 1 TABLET BY MOUTH ONCE  DAILY (Patient not taking: Reported on 02/12/2024)   methimazole (TAPAZOLE) 5 MG tablet Take 5 mg by mouth. (Patient not taking: Reported on 02/12/2024)   No facility-administered medications prior to visit.    Review of Systems  Constitutional: Negative.  Negative for chills, fever and malaise/fatigue.  HENT: Negative.  Negative for congestion and sore throat.   Eyes: Negative.  Negative for blurred vision and pain.  Respiratory: Negative.  Negative for cough and shortness of breath.   Cardiovascular: Negative.  Negative for chest pain, palpitations and leg swelling.  Gastrointestinal: Negative.  Negative for abdominal pain, blood in stool, constipation, diarrhea, heartburn, melena, nausea and vomiting.  Genitourinary: Negative.  Negative for dysuria, flank pain, frequency and urgency.  Musculoskeletal: Negative.  Negative for joint pain and myalgias.  Skin: Negative.   Neurological: Negative.  Negative for dizziness, tingling, sensory change, weakness and headaches.  Endo/Heme/Allergies: Negative.   Psychiatric/Behavioral: Negative.  Negative for depression and suicidal ideas. The patient is not nervous/anxious.        Objective:   BP 122/76   Pulse 70   Ht 5' 3 (1.6 m)   Wt 166 lb 9.6 oz (75.6 kg)   SpO2 98%   BMI 29.51 kg/m   Vitals:   02/12/24 1430  BP: 122/76  Pulse: 70  Height: 5' 3 (1.6 m)  Weight: 166 lb 9.6 oz (75.6 kg)  SpO2: 98%  BMI (Calculated): 29.52    Physical Exam Vitals and nursing note reviewed.  Constitutional:      Appearance: Normal appearance.  HENT:     Head:  Normocephalic and atraumatic.     Nose: Nose normal.     Mouth/Throat:     Mouth: Mucous membranes are moist.     Pharynx: Oropharynx is clear.  Eyes:     Conjunctiva/sclera: Conjunctivae normal.     Pupils: Pupils are equal, round, and reactive to light.  Cardiovascular:     Rate and Rhythm: Normal rate and regular rhythm.     Pulses: Normal pulses.     Heart sounds: Normal heart sounds. No murmur heard. Pulmonary:     Effort: Pulmonary effort is normal.     Breath sounds: Normal breath  sounds. No wheezing.  Abdominal:     General: Bowel sounds are normal.     Palpations: Abdomen is soft.     Tenderness: There is no abdominal tenderness. There is no right CVA tenderness or left CVA tenderness.  Musculoskeletal:        General: Normal range of motion.     Cervical back: Normal range of motion.     Right lower leg: No edema.     Left lower leg: No edema.  Skin:    General: Skin is warm and dry.  Neurological:     General: No focal deficit present.     Mental Status: She is alert and oriented to person, place, and time.  Psychiatric:        Mood and Affect: Mood normal.        Behavior: Behavior normal.      No results found for any visits on 02/12/24.  Recent Results (from the past 2160 hours)  Surgical pathology     Status: None   Collection Time: 02/02/24 12:00 AM  Result Value Ref Range   SURGICAL PATHOLOGY      SURGICAL PATHOLOGY [facility] [address] [place], [place] [address] Telephone [phone] or [phone] Fax [phone]  REPORT OF SURGICAL PATHOLOGY   Accession #: [id number] Patient Name: CONTESSA, PREUSS Visit # : [id number]  MRN: [id number] Physician: Therisa Bi DOB/Age Sep 08, 1945 (Age: 17) Gender: F Collected Date: 02/02/2024 Received Date: 02/02/2024  FINAL DIAGNOSIS       1. Ascending  Colon Polyp, x7 cold snare :       FRAGMENTS OF TUBULAR ADENOMA.      NEGATIVE FOR HIGH-GRADE DYSPLASIA.        2. Descending Colon Polyp, cold snare :       TUBULAR ADENOMA.      NEGATIVE FOR HIGH-GRADE DYSPLASIA.       ELECTRONIC SIGNATURE : Pepper Dutton Md, Pathologist, Electronic Signature  MICROSCOPIC DESCRIPTION  CASE COMMENTS STAINS USED IN DIAGNOSIS: H&E H&E    CLINICAL HISTORY  SPECIMEN(S) OBTAINED 1. Ascending  Colon Polyp, X7 Cold Snare 2. Descending Colon Polyp, Cold Snare  SPECIMEN COMMENTS: SPECIME N CLINICAL INFORMATION: 1. Family history of polyps in the colon, colon polyps    Gross Description 1. Received in formalin are tan, soft tissue fragments that are submitted in toto.Number:  7,   Size:  0.5 cm, 1 block 2. Received in formalin are tan, soft tissue fragments that are submitted in toto.Number:  2,   Size:  0.5 cm, 1 block.mb 02-02-24        Report signed out from the following location(s) [place]. [facility] [address] [place], [place] [address] CLIA #: [id number]  [facility] [address] [place], [place] [address] CLIA #: [id number]       Assessment & Plan:  Continue current medications.  Check fasting labs.  Flu shot today. Problem List Items Addressed This Visit     Essential hypertension, benign - Primary   Relevant Orders   CMP14+EGFR   Gouty arthritis   Relevant Orders   CBC with Diff   Uric acid   Mixed hyperlipidemia   Relevant Orders   CMP14+EGFR   Lipid Panel w/o Chol/HDL Ratio   Hyperthyroidism   Vitamin B12 deficiency   Relevant Medications   cyanocobalamin (VITAMIN B12) 1000 MCG tablet   Other Visit Diagnoses       Prediabetes  Relevant Orders   Hemoglobin A1c       Return in about 3 months (around 05/14/2024).   Total time spent: 30 minutes. This time includes review of previous notes and results and patient face to face interaction during today's visit.    FERNAND FREDY RAMAN, MD  02/12/2024   This document may have been prepared by [facility] Voice Recognition software and  as such may include unintentional dictation errors.

## 2024-03-29 ENCOUNTER — Ambulatory Visit: Admitting: Internal Medicine

## 2024-03-29 ENCOUNTER — Encounter: Payer: Self-pay | Admitting: Internal Medicine

## 2024-03-29 VITALS — BP 152/86 | HR 101 | Ht 63.0 in | Wt 167.0 lb

## 2024-03-29 DIAGNOSIS — M109 Gout, unspecified: Secondary | ICD-10-CM

## 2024-03-29 DIAGNOSIS — R7303 Prediabetes: Secondary | ICD-10-CM | POA: Insufficient documentation

## 2024-03-29 DIAGNOSIS — E538 Deficiency of other specified B group vitamins: Secondary | ICD-10-CM

## 2024-03-29 DIAGNOSIS — Z0000 Encounter for general adult medical examination without abnormal findings: Secondary | ICD-10-CM | POA: Insufficient documentation

## 2024-03-29 DIAGNOSIS — E782 Mixed hyperlipidemia: Secondary | ICD-10-CM

## 2024-03-29 DIAGNOSIS — E663 Overweight: Secondary | ICD-10-CM

## 2024-03-29 DIAGNOSIS — Z0001 Encounter for general adult medical examination with abnormal findings: Secondary | ICD-10-CM

## 2024-03-29 DIAGNOSIS — Z13820 Encounter for screening for osteoporosis: Secondary | ICD-10-CM | POA: Insufficient documentation

## 2024-03-29 DIAGNOSIS — Z6829 Body mass index (BMI) 29.0-29.9, adult: Secondary | ICD-10-CM | POA: Insufficient documentation

## 2024-03-29 DIAGNOSIS — I10 Essential (primary) hypertension: Secondary | ICD-10-CM

## 2024-03-29 NOTE — Progress Notes (Signed)
 Established Patient Office Visit  Subjective:  Patient ID: Laurie Spears, female    DOB: 06-10-1945  Age: 78 y.o. MRN: [id number]  Chief Complaint  Patient presents with   Annual Exam    AWV    Patient is here today for her Medicare AWV.  She reports overall feeling well but reports her throat has been feeling sore Sunday morning. She reports mild sinus congestion and rhinorrhea. She has been doing salt water gargles and started taking dayquil and nyquil yesterday. She also endorses using her Nasacort and claritin daily. She reports being around her great grandchild that has congestion, cough and not been able to go to daycare. Denies fever and chills. Reports grand child tested negative for covid, flu, RSV. Patient would like to wait and test at home if she continues to feel bad instead of doing a swab today. Recommend patient continue symptom management and FU if her symptoms don't improve.  Patient has elevated BP today. She has not taken any of her medications yet today. Discussed avoiding decongestants as they can cause hypertension. Reinforced healthy diet and exercise as tolerated to improve blood pressure while promoting weight loss.  Patient reports having EEG completed for undetermined syncope but I am unable to find the results. She was recommended to FU 04/2024 with Neurology. Patient denies any additional syncope episodes since she saw Neurology 12/2023. MRI was unremarkable for acute abnormalities.  She is due for routine blood work today. They have previously been ordered and she will get them drawn today as she is fasting. Patient has mammogram completed 12/2023; colonoscopy last completed 01/2024. She is however due for DEXA scan. Will order.  PHQ-9 score 3; GAD-7 score 1; 6CIT score of 2.    No other concerns at this time.   Past Medical History:  Diagnosis Date   Gouty arthritis    High cholesterol    Hypertension    Hyperthyroidism    Prediabetes     Past  Surgical History:  Procedure Laterality Date   ABDOMINAL HYSTERECTOMY  1980   BREAST EXCISIONAL BIOPSY Bilateral yrs ago   benign   COLONOSCOPY     x4   COLONOSCOPY N/A 02/02/2024   Procedure: COLONOSCOPY;  Surgeon: Therisa Bi, MD;  Location: [facility] ENDOSCOPY;  Service: Gastroenterology;  Laterality: N/A;   POLYPECTOMY  02/02/2024   Procedure: POLYPECTOMY, INTESTINE;  Surgeon: Therisa Bi, MD;  Location: [facility] ENDOSCOPY;  Service: Gastroenterology;;    Social History   Socioeconomic History   Marital status: Divorced    Spouse name: Not on file   Number of children: Not on file   Years of education: Not on file   Highest education level: Not on file  Occupational History   Not on file  Tobacco Use   Smoking status: Former    Current packs/day: 0.00    Types: Cigarettes    Quit date: 06/26/1994    Years since quitting: 29.7   Smokeless tobacco: Not on file  Vaping Use   Vaping status: Never Used  Substance and Sexual Activity   Alcohol use: No    Alcohol/week: 0.0 standard drinks of alcohol   Drug use: Never   Sexual activity: Not on file  Other Topics Concern   Not on file  Social History Narrative   LIVES KATHLEE JETTY   Social Drivers of Health   Financial Resource Strain: Low Risk  (01/16/2024)   Received from [facility]   Overall Financial Resource Strain (CARDIA)  Difficulty of Paying Living Expenses: Not very hard  Food Insecurity: No Food Insecurity (01/16/2024)   Received from [facility]   Hunger Vital Sign    Within the past 12 months, you worried that your food would run out before you got the money to buy more.: Never true    Within the past 12 months, the food you bought just didn't last and you didn't have money to get more.: Never true  Transportation Needs: No Transportation Needs (01/16/2024)   Received from [facility] - Transportation    In the past 12 months, has lack of  transportation kept you from medical appointments or from getting medications?: No    Lack of Transportation (Non-Medical): No  Physical Activity: Not on file  Stress: Not on file  Social Connections: Not on file  Intimate Partner Violence: Not on file    Family History  Problem Relation Age of Onset   Breast cancer Neg Hx     No Known Allergies  Outpatient Medications Prior to Visit  Medication Sig   alendronate (FOSAMAX) 70 MG tablet TAKE 1 TABLET BY MOUTH WEEKLY  WITH 8 OZ OF PLAIN WATER 30  MINUTES BEFORE FIRST FOOD, DRINK OR MEDS. STAY UPRIGHT FOR 30  MINS   Calcium Carb-Cholecalciferol (CALCIUM HIGH POTENCY/VITAMIN D) 600-5 MG-MCG TABS Take 1 tablet by mouth 2 (two) times daily.   cyanocobalamin (VITAMIN B12) 1000 MCG tablet Take 1 tablet (1,000 mcg total) by mouth daily.   losartan (COZAAR) 100 MG tablet TAKE 1 TABLET BY MOUTH DAILY   meloxicam (MOBIC) 15 MG tablet Take 1 tablet by mouth once daily   methimazole (TAPAZOLE) 5 MG tablet Take 2.5 mg by mouth daily.   oxyCODONE -acetaminophen  (PERCOCET) 5-325 MG tablet Take 1 tablet by mouth every 4 (four) hours as needed for severe pain (pain score 7-10).   rosuvastatin (CRESTOR) 40 MG tablet TAKE 1 TABLET BY MOUTH ONCE  DAILY   spironolactone (ALDACTONE) 25 MG tablet TAKE 1 TABLET BY MOUTH ONCE  DAILY   allopurinol (ZYLOPRIM) 100 MG tablet TAKE 1 TABLET BY MOUTH ONCE  DAILY (Patient not taking: Reported on 03/29/2024)   methimazole (TAPAZOLE) 5 MG tablet Take 5 mg by mouth. (Patient not taking: Reported on 03/29/2024)   No facility-administered medications prior to visit.    Review of Systems  Constitutional: Negative.  Negative for chills, fever and malaise/fatigue.  HENT:  Positive for congestion and sore throat.   Eyes: Negative.  Negative for blurred vision and pain.  Respiratory:  Positive for cough. Negative for shortness of breath.   Cardiovascular: Negative.  Negative for chest pain, palpitations and leg swelling.   Gastrointestinal: Negative.  Negative for abdominal pain, blood in stool, constipation, diarrhea, heartburn, melena, nausea and vomiting.  Genitourinary: Negative.  Negative for dysuria, flank pain, frequency and urgency.  Musculoskeletal: Negative.  Negative for joint pain and myalgias.  Skin: Negative.   Neurological:  Negative for dizziness, tingling, sensory change, weakness and headaches.  Endo/Heme/Allergies: Negative.   Psychiatric/Behavioral: Negative.  Negative for depression and suicidal ideas. The patient is not nervous/anxious.        Objective:   BP (!) 152/86   Pulse (!) 101   Ht 5' 3 (1.6 m)   Wt 167 lb (75.8 kg)   SpO2 99%   BMI 29.58 kg/m   Vitals:   03/29/24 1039  BP: (!) 152/86  Pulse: (!) 101  Height: 5' 3 (1.6 m)  Weight:  167 lb (75.8 kg)  SpO2: 99%  BMI (Calculated): 29.59    Physical Exam Vitals and nursing note reviewed.  Constitutional:      Appearance: Normal appearance.  HENT:     Head: Normocephalic and atraumatic.     Nose: Rhinorrhea present. Rhinorrhea is clear.     Right Turbinates: Swollen.     Left Turbinates: Swollen.     Mouth/Throat:     Mouth: Mucous membranes are moist.     Pharynx: Oropharynx is clear.  Eyes:     Conjunctiva/sclera: Conjunctivae normal.     Pupils: Pupils are equal, round, and reactive to light.  Cardiovascular:     Rate and Rhythm: Normal rate and regular rhythm.     Pulses: Normal pulses.     Heart sounds: Normal heart sounds. No murmur heard. Pulmonary:     Effort: Pulmonary effort is normal.     Breath sounds: Normal breath sounds. No wheezing.  Abdominal:     General: Bowel sounds are normal.     Palpations: Abdomen is soft.     Tenderness: There is no abdominal tenderness. There is no right CVA tenderness or left CVA tenderness.  Musculoskeletal:        General: Normal range of motion.     Cervical back: Normal range of motion.     Right lower leg: No edema.     Left lower leg: No edema.   Skin:    General: Skin is warm and dry.  Neurological:     General: No focal deficit present.     Mental Status: She is alert and oriented to person, place, and time.  Psychiatric:        Mood and Affect: Mood normal.        Behavior: Behavior normal.      No results found for any visits on 03/29/24.  Recent Results (from the past 2160 hours)  Surgical pathology     Status: None   Collection Time: 02/02/24 12:00 AM  Result Value Ref Range   SURGICAL PATHOLOGY      SURGICAL PATHOLOGY [facility] [address], [address] [place], [place] [address] Telephone [phone] or [phone] Fax [phone]  REPORT OF SURGICAL PATHOLOGY   Accession #: [id number] Patient Name: DREW, HERMAN Visit # : [id number]  MRN: [id number] Physician: Therisa Bi DOB/Age [birth year] (Age: 25) Gender: F Collected Date: 02/02/2024 Received Date: 02/02/2024  FINAL DIAGNOSIS       1. Ascending  Colon Polyp, x7 cold snare :       FRAGMENTS OF TUBULAR ADENOMA.      NEGATIVE FOR HIGH-GRADE DYSPLASIA.       2. Descending Colon Polyp, cold snare :       TUBULAR ADENOMA.      NEGATIVE FOR HIGH-GRADE DYSPLASIA.       ELECTRONIC SIGNATURE : Pepper Dutton Md, Pathologist, Electronic Signature  MICROSCOPIC DESCRIPTION  CASE COMMENTS STAINS USED IN DIAGNOSIS: H&E H&E    CLINICAL HISTORY  SPECIMEN(S) OBTAINED 1. Ascending  Colon Polyp, X7 Cold Snare 2. Descending Colon Polyp, Cold Snare  SPECIMEN COMMENTS: SPECIME N CLINICAL INFORMATION: 1. Family history of polyps in the colon, colon polyps    Gross Description 1. Received in formalin are tan, soft tissue fragments that are submitted in toto.Number:  7,   Size:  0.5 cm, 1 block 2. Received in formalin are tan, soft tissue fragments that are submitted in toto.Number:  2,   Size:  0.5  cm, 1 block.mb 02-02-24        Report signed out from the following location(s) [place]. [facility] [address] [place], [place] [address] CLIA #: [id number]  [facility] [address] [place], [place] [address] CLIA #: [id number]       Assessment & Plan:  Continue taking medications as prescribed. Check routine blood work today and FU with patient on results. DEXA scan ordered. Reinforced healthy diet and exercise as tolerated. Discussed symptom management for head cold and to FU if symptoms fail to improve or worsen. Problem List Items Addressed This Visit     Essential hypertension, benign   Gouty arthritis   Mixed hyperlipidemia   Vitamin B12 deficiency   Screening for osteoporosis   Relevant Orders   DG Bone Density   Prediabetes   Medicare annual wellness visit, subsequent - Primary    Return in about 3 weeks (around 04/19/2024).   Total time spent: 30 minutes. This time includes review of previous notes and results and patient face to face interaction during today's visit.    FERNAND FREDY RAMAN, MD  03/29/2024   This document may have been prepared by [facility] Voice Recognition software and as such may include unintentional dictation errors.

## 2024-03-30 ENCOUNTER — Ambulatory Visit: Payer: Self-pay | Admitting: Internal Medicine

## 2024-03-30 LAB — CMP14+EGFR
ALT: 25 IU/L (ref 0–32)
AST: 21 IU/L (ref 0–40)
Albumin: 4.4 g/dL (ref 3.8–4.8)
Alkaline Phosphatase: 65 IU/L (ref 49–135)
BUN/Creatinine Ratio: 10 — ABNORMAL LOW (ref 12–28)
BUN: 9 mg/dL (ref 8–27)
Bilirubin Total: 1 mg/dL (ref 0.0–1.2)
CO2: 25 mmol/L (ref 20–29)
Calcium: 9.3 mg/dL (ref 8.7–10.3)
Chloride: 107 mmol/L — ABNORMAL HIGH (ref 96–106)
Creatinine, Ser: 0.88 mg/dL (ref 0.57–1.00)
Globulin, Total: 2.2 g/dL (ref 1.5–4.5)
Glucose: 107 mg/dL — ABNORMAL HIGH (ref 70–99)
Potassium: 4.4 mmol/L (ref 3.5–5.2)
Sodium: 144 mmol/L (ref 134–144)
Total Protein: 6.6 g/dL (ref 6.0–8.5)
eGFR: 68 mL/min/1.73 (ref >59)

## 2024-03-30 LAB — CBC WITH DIFFERENTIAL/PLATELET
Basophils Absolute: 0 x10E3/uL (ref 0.0–0.2)
Basos: 0 %
EOS (ABSOLUTE): 0.3 x10E3/uL (ref 0.0–0.4)
Eos: 4 %
Hematocrit: 40 % (ref 34.0–46.6)
Hemoglobin: 13.3 g/dL (ref 11.1–15.9)
Immature Grans (Abs): 0 x10E3/uL (ref 0.0–0.1)
Immature Granulocytes: 0 %
Lymphocytes Absolute: 1 x10E3/uL (ref 0.7–3.1)
Lymphs: 15 %
MCH: 29.4 pg (ref 26.6–33.0)
MCHC: 33.3 g/dL (ref 31.5–35.7)
MCV: 88 fL (ref 79–97)
Monocytes Absolute: 0.5 x10E3/uL (ref 0.1–0.9)
Monocytes: 8 %
Neutrophils Absolute: 4.7 x10E3/uL (ref 1.4–7.0)
Neutrophils: 73 %
Platelets: 240 x10E3/uL (ref 150–450)
RBC: 4.53 x10E6/uL (ref 3.77–5.28)
RDW: 13.1 % (ref 11.7–15.4)
WBC: 6.6 x10E3/uL (ref 3.4–10.8)

## 2024-03-30 LAB — HEMOGLOBIN A1C
Est. average glucose Bld gHb Est-mCnc: 128 mg/dL
Hgb A1c MFr Bld: 6.1 % — ABNORMAL HIGH (ref 4.8–5.6)

## 2024-03-30 LAB — LIPID PANEL W/O CHOL/HDL RATIO
Cholesterol, Total: 141 mg/dL (ref 100–199)
HDL: 49 mg/dL (ref >39)
LDL Chol Calc (NIH): 75 mg/dL (ref 0–99)
Triglycerides: 89 mg/dL (ref 0–149)
VLDL Cholesterol Cal: 17 mg/dL (ref 5–40)

## 2024-03-30 LAB — URIC ACID: Uric Acid: 5.5 mg/dL (ref 3.1–7.9)

## 2024-03-30 NOTE — Progress Notes (Signed)
 Patient notified

## 2024-04-08 ENCOUNTER — Telehealth: Payer: Self-pay | Admitting: Internal Medicine

## 2024-04-08 ENCOUNTER — Other Ambulatory Visit: Payer: Self-pay | Admitting: Internal Medicine

## 2024-04-08 DIAGNOSIS — J0100 Acute maxillary sinusitis, unspecified: Secondary | ICD-10-CM

## 2024-04-08 MED ORDER — AMOXICILLIN-POT CLAVULANATE 875-125 MG PO TABS: 1.0000 | ORAL_TABLET | Freq: Two times a day (BID) | ORAL | 0 refills | Status: AC

## 2024-04-08 NOTE — Telephone Encounter (Signed)
 Patient called in stating she is having congestion, blowing nose, stuffy nose, some yellow phlegm, coughing spells. Has been going on for over a week. Was seen last week on 12/8 and [name] told her if not better in a week to call us  because she may need antibiotics. Patient reports still having symptoms and not feeling well. Please advise.   [facility] - Laurie Spears

## 2024-04-19 ENCOUNTER — Ambulatory Visit: Admitting: Internal Medicine

## 2024-04-19 ENCOUNTER — Encounter: Payer: Self-pay | Admitting: Internal Medicine

## 2024-04-19 VITALS — BP 122/76 | HR 67 | Ht 63.0 in | Wt 167.8 lb

## 2024-04-19 DIAGNOSIS — E782 Mixed hyperlipidemia: Secondary | ICD-10-CM | POA: Diagnosis not present

## 2024-04-19 DIAGNOSIS — R7303 Prediabetes: Secondary | ICD-10-CM | POA: Diagnosis not present

## 2024-04-19 DIAGNOSIS — E0590 Thyrotoxicosis, unspecified without thyrotoxic crisis or storm: Secondary | ICD-10-CM | POA: Diagnosis not present

## 2024-04-19 DIAGNOSIS — I10 Essential (primary) hypertension: Secondary | ICD-10-CM | POA: Diagnosis not present

## 2024-04-19 MED ORDER — SPIRONOLACTONE 25 MG PO TABS: 25.0000 mg | ORAL_TABLET | Freq: Every day | ORAL | 2 refills | Status: AC

## 2024-04-19 NOTE — Progress Notes (Signed)
 "  Established Patient Office Visit  Subjective:  Patient ID: Laurie Spears, female    DOB: 1946-03-11  Age: 78 y.o. MRN: [id number]  Chief Complaint  Patient presents with   Follow-up    3 week follow up    Patient comes in for her follow-up today.  She is generally feeling much better than before.  Took Augmentin  and had cleared up her sinus issues.  Denies any further headaches, no PND, no sore throat and no cough. Her blood pressure is also looking much better than before.  Currently she is taking all her medications as prescribed.  Labs were done earlier and results discussed already.  She is due for mammogram in February, will send in an order.  She recently had a colonoscopy in 01/2024 and tubular adenoma was discovered.    No other concerns at this time.   Past Medical History:  Diagnosis Date   Gouty arthritis    High cholesterol    Hypertension    Hyperthyroidism    Prediabetes     Past Surgical History:  Procedure Laterality Date   ABDOMINAL HYSTERECTOMY  1980   BREAST EXCISIONAL BIOPSY Bilateral yrs ago   benign   COLONOSCOPY     x4   COLONOSCOPY N/A 02/02/2024   Procedure: COLONOSCOPY;  Surgeon: Therisa Bi, MD;  Location: [facility] ENDOSCOPY;  Service: Gastroenterology;  Laterality: N/A;   POLYPECTOMY  02/02/2024   Procedure: POLYPECTOMY, INTESTINE;  Surgeon: Therisa Bi, MD;  Location: [facility] ENDOSCOPY;  Service: Gastroenterology;;    Social History   Socioeconomic History   Marital status: Divorced    Spouse name: Not on file   Number of children: Not on file   Years of education: Not on file   Highest education level: Not on file  Occupational History   Not on file  Tobacco Use   Smoking status: Former    Current packs/day: 0.00    Types: Cigarettes    Quit date: 06/26/1994    Years since quitting: 29.8   Smokeless tobacco: Not on file  Vaping Use   Vaping status: Never Used  Substance and Sexual Activity   Alcohol use: No    Alcohol/week: 0.0  standard drinks of alcohol   Drug use: Never   Sexual activity: Not on file  Other Topics Concern   Not on file  Social History Narrative   [name]   Social Drivers of Health   Tobacco Use: Medium Risk (04/19/2024)   Patient History    Smoking Tobacco Use: Former    Smokeless Tobacco Use: Unknown    Passive Exposure: Not on Actuary Strain: Low Risk  (01/16/2024)   Received from [facility]   Overall Financial Resource Strain (CARDIA)    Difficulty of Paying Living Expenses: Not very hard  Food Insecurity: No Food Insecurity (01/16/2024)   Received from [facility]   Epic    Within the past 12 months, you worried that your food would run out before you got the money to buy more.: Never true    Within the past 12 months, the food you bought just didn't last and you didn't have money to get more.: Never true  Transportation Needs: No Transportation Needs (01/16/2024)   Received from [facility] - Transportation    In the past 12 months, has lack of transportation kept you from medical appointments or from getting medications?: No    Lack  of Transportation (Non-Medical): No  Physical Activity: Not on file  Stress: Not on file  Social Connections: Not on file  Intimate Partner Violence: Not on file  Depression (PHQ2-9): Low Risk (03/29/2024)   Depression (PHQ2-9)    PHQ-2 Score: 3  Alcohol Screen: Not on file  Housing: Low Risk  (01/16/2024)   Received from [facility]   Epic    In the last 12 months, was there a time when you were not able to pay the mortgage or rent on time?: No    In the past 12 months, how many times have you moved where you were living?: 0    At any time in the past 12 months, were you homeless or living in a shelter (including now)?: No  Utilities: Not At Risk (01/16/2024)   Received from [facility]   Epic    In the past 12  months has the electric, gas, oil, or water company threatened to shut off services in your home?: No  Health Literacy: Not on file    Family History  Problem Relation Age of Onset   Breast cancer Neg Hx     Allergies[1]  Show/hide medication list[2]  Review of Systems  Constitutional: Negative.  Negative for chills, fever and malaise/fatigue.  HENT: Negative.  Negative for congestion and sore throat.   Eyes: Negative.  Negative for blurred vision and pain.  Respiratory: Negative.  Negative for cough and shortness of breath.   Cardiovascular: Negative.  Negative for chest pain, palpitations and leg swelling.  Gastrointestinal: Negative.  Negative for abdominal pain, blood in stool, constipation, diarrhea, heartburn, melena, nausea and vomiting.  Genitourinary: Negative.  Negative for dysuria, flank pain, frequency and urgency.  Musculoskeletal: Negative.  Negative for joint pain and myalgias.  Skin: Negative.   Neurological: Negative.  Negative for dizziness, tingling, sensory change, weakness and headaches.  Endo/Heme/Allergies: Negative.   Psychiatric/Behavioral: Negative.  Negative for depression and suicidal ideas. The patient is not nervous/anxious.        Objective:   BP 122/76   Pulse 67   Ht 5' 3 (1.6 m)   Wt 167 lb 12.8 oz (76.1 kg)   SpO2 98%   BMI 29.72 kg/m   Vitals:   04/19/24 1407  BP: 122/76  Pulse: 67  Height: 5' 3 (1.6 m)  Weight: 167 lb 12.8 oz (76.1 kg)  SpO2: 98%  BMI (Calculated): 29.73    Physical Exam Vitals and nursing note reviewed.  Constitutional:      Appearance: Normal appearance.  HENT:     Head: Normocephalic and atraumatic.     Nose: Nose normal.     Mouth/Throat:     Mouth: Mucous membranes are moist.     Pharynx: Oropharynx is clear.  Eyes:     Conjunctiva/sclera: Conjunctivae normal.     Pupils: Pupils are equal, round, and reactive to light.  Cardiovascular:     Rate and Rhythm: Normal rate and regular rhythm.      Pulses: Normal pulses.     Heart sounds: Normal heart sounds. No murmur heard. Pulmonary:     Effort: Pulmonary effort is normal.     Breath sounds: Normal breath sounds. No wheezing.  Abdominal:     General: Bowel sounds are normal.     Palpations: Abdomen is soft.     Tenderness: There is no abdominal tenderness. There is no right CVA tenderness or left CVA tenderness.  Musculoskeletal:  General: Normal range of motion.     Cervical back: Normal range of motion.     Right lower leg: No edema.     Left lower leg: No edema.  Skin:    General: Skin is warm and dry.  Neurological:     General: No focal deficit present.     Mental Status: She is alert and oriented to person, place, and time.  Psychiatric:        Mood and Affect: Mood normal.        Behavior: Behavior normal.      No results found for any visits on 04/19/24.  Recent Results (from the past 2160 hours)  Surgical pathology     Status: None   Collection Time: 02/02/24 12:00 AM  Result Value Ref Range   SURGICAL PATHOLOGY      SURGICAL PATHOLOGY [facility] [address] [place], [place] [address] Telephone [phone] or [phone] Fax [phone]  REPORT OF SURGICAL PATHOLOGY   Accession #: [id number] Patient Name: VERLEY, PARISEAU Visit # : [id number]  MRN: [id number] Physician: Therisa Bi DOB/Age [birth year] (Age: 49) Gender: F Collected Date: 02/02/2024 Received Date: 02/02/2024  FINAL DIAGNOSIS       1. Ascending  Colon Polyp, x7 cold snare :       FRAGMENTS OF TUBULAR ADENOMA.      NEGATIVE FOR HIGH-GRADE DYSPLASIA.       2. Descending Colon Polyp, cold snare :       TUBULAR ADENOMA.      NEGATIVE FOR HIGH-GRADE DYSPLASIA.       ELECTRONIC SIGNATURE : Pepper Dutton Md, Pathologist, Electronic Signature  MICROSCOPIC DESCRIPTION  CASE COMMENTS STAINS USED IN DIAGNOSIS: H&E H&E    CLINICAL HISTORY  SPECIMEN(S) OBTAINED 1. Ascending  Colon  Polyp, X7 Cold Snare 2. Descending Colon Polyp, Cold Snare  SPECIMEN COMMENTS: SPECIME N CLINICAL INFORMATION: 1. Family history of polyps in the colon, colon polyps    Gross Description 1. Received in formalin are tan, soft tissue fragments that are submitted in toto.Number:  7,   Size:  0.5 cm, 1 block 2. Received in formalin are tan, soft tissue fragments that are submitted in toto.Number:  2,   Size:  0.5 cm, 1 block.mb 02-02-24        Report signed out from the following location(s) [place] [address] [place], [place] [address] CLIA #: [id number]  [facility] [address] [place], [place] [address] CLIA #: [id number]   CBC with Diff     Status: None   Collection Time: 03/29/24 11:37 AM  Result Value Ref Range   WBC 6.6 3.4 - 10.8 x10E3/uL   RBC 4.53 3.77 - 5.28 x10E6/uL   Hemoglobin 13.3 11.1 - 15.9 g/dL   Hematocrit 59.9 65.9 - 46.6 %   MCV 88 79 - 97 fL   MCH 29.4 26.6 - 33.0 pg   MCHC 33.3 31.5 - 35.7 g/dL   RDW 86.8 88.2 - 84.5 %   Platelets 240 150 - 450 x10E3/uL   Neutrophils 73 Not Estab. %   Lymphs 15 Not Estab. %   Monocytes 8 Not Estab. %   Eos 4 Not Estab. %   Basos 0 Not Estab. %   Neutrophils Absolute 4.7 1.4 - 7.0 x10E3/uL   Lymphocytes Absolute 1.0 0.7 - 3.1 x10E3/uL   Monocytes Absolute 0.5 0.1 - 0.9 x10E3/uL   EOS (ABSOLUTE) 0.3 0.0 -  0.4 x10E3/uL   Basophils Absolute 0.0 0.0 - 0.2 x10E3/uL   Immature Granulocytes 0 Not Estab. %   Immature Grans (Abs) 0.0 0.0 - 0.1 x10E3/uL  CMP14+EGFR     Status: Abnormal   Collection Time: 03/29/24 11:37 AM  Result Value Ref Range   Glucose 107 (H) 70 - 99 mg/dL   BUN 9 8 - 27 mg/dL   Creatinine, Ser 9.11 0.57 - 1.00 mg/dL   eGFR 68 >40 fO/fpw/8.26   BUN/Creatinine Ratio 10 (L) 12 - 28   Sodium 144 134 - 144 mmol/L   Potassium 4.4 3.5 - 5.2 mmol/L   Chloride 107 (H) 96 - 106 mmol/L   CO2 25 20 - 29 mmol/L   Calcium 9.3 8.7 - 10.3 mg/dL   Total Protein  6.6 6.0 - 8.5 g/dL   Albumin 4.4 3.8 - 4.8 g/dL   Globulin, Total 2.2 1.5 - 4.5 g/dL   Bilirubin Total 1.0 0.0 - 1.2 mg/dL   Alkaline Phosphatase 65 49 - 135 IU/L   AST 21 0 - 40 IU/L   ALT 25 0 - 32 IU/L  Lipid Panel w/o Chol/HDL Ratio     Status: None   Collection Time: 03/29/24 11:37 AM  Result Value Ref Range   Cholesterol, Total 141 100 - 199 mg/dL   Triglycerides 89 0 - 149 mg/dL   HDL 49 >60 mg/dL   VLDL Cholesterol Cal 17 5 - 40 mg/dL   LDL Chol Calc (NIH) 75 0 - 99 mg/dL  Uric acid     Status: None   Collection Time: 03/29/24 11:37 AM  Result Value Ref Range   Uric Acid 5.5 3.1 - 7.9 mg/dL    Comment:            Therapeutic target for gout patients: <6.0  Hemoglobin A1c     Status: Abnormal   Collection Time: 03/29/24 11:37 AM  Result Value Ref Range   Hgb A1c MFr Bld 6.1 (H) 4.8 - 5.6 %    Comment:          Prediabetes: 5.7 - 6.4          Diabetes: >6.4          Glycemic control for adults with diabetes: <7.0    Est. average glucose Bld gHb Est-mCnc 128 mg/dL      Assessment & Plan:  Continue current medications.  Monitor blood pressure and weight. Problem List Items Addressed This Visit       Cardiovascular and Mediastinum   Essential hypertension, benign - Primary   Relevant Medications   spironolactone  (ALDACTONE ) 25 MG tablet     Endocrine   Hyperthyroidism     Other   Mixed hyperlipidemia   Relevant Medications   spironolactone  (ALDACTONE ) 25 MG tablet   Prediabetes    Return in about 3 months (around 07/18/2024).   Total time spent: 30 minutes. This time includes review of previous notes and results and patient face to face interaction during today's visit.    FERNAND FREDY RAMAN, MD  04/19/2024   This document may have been prepared by [facility] Voice Recognition software and as such may include unintentional dictation errors.     [1] No Known Allergies [2]  Outpatient Medications Prior to Visit  Medication Sig   alendronate (FOSAMAX) 70 MG  tablet TAKE 1 TABLET BY MOUTH WEEKLY  WITH 8 OZ OF PLAIN WATER 30  MINUTES BEFORE FIRST FOOD, DRINK OR MEDS. STAY UPRIGHT FOR 30  MINS  amoxicillin -clavulanate (AUGMENTIN ) 875-125 MG tablet Take 1 tablet by mouth 2 (two) times daily.   Calcium Carb-Cholecalciferol (CALCIUM HIGH POTENCY/VITAMIN D) 600-5 MG-MCG TABS Take 1 tablet by mouth 2 (two) times daily.   cyanocobalamin (VITAMIN B12) 1000 MCG tablet Take 1 tablet (1,000 mcg total) by mouth daily.   losartan (COZAAR) 100 MG tablet TAKE 1 TABLET BY MOUTH DAILY   meloxicam (MOBIC) 15 MG tablet Take 1 tablet by mouth once daily   methimazole (TAPAZOLE) 5 MG tablet Take 2.5 mg by mouth daily.   oxyCODONE -acetaminophen  (PERCOCET) 5-325 MG tablet Take 1 tablet by mouth every 4 (four) hours as needed for severe pain (pain score 7-10).   rosuvastatin (CRESTOR) 40 MG tablet TAKE 1 TABLET BY MOUTH ONCE  DAILY   [DISCONTINUED] spironolactone  (ALDACTONE ) 25 MG tablet TAKE 1 TABLET BY MOUTH ONCE  DAILY   allopurinol (ZYLOPRIM) 100 MG tablet TAKE 1 TABLET BY MOUTH ONCE  DAILY (Patient not taking: Reported on 04/19/2024)   methimazole (TAPAZOLE) 5 MG tablet Take 5 mg by mouth. (Patient not taking: Reported on 04/19/2024)   No facility-administered medications prior to visit.   "

## 2024-05-02 ENCOUNTER — Other Ambulatory Visit: Payer: Self-pay | Admitting: Family

## 2024-05-25 ENCOUNTER — Other Ambulatory Visit: Payer: Self-pay | Admitting: Family

## 2024-08-03 ENCOUNTER — Ambulatory Visit: Admitting: Internal Medicine
